# Patient Record
Sex: Female | Born: 1940
Health system: Southern US, Community
[De-identification: ages and names within clinical notes are randomized; demographics above are authoritative.]

## PROBLEM LIST (undated history)

## (undated) DIAGNOSIS — M1990 Unspecified osteoarthritis, unspecified site: Secondary | ICD-10-CM

## (undated) DIAGNOSIS — M719 Bursopathy, unspecified: Secondary | ICD-10-CM

## (undated) DIAGNOSIS — M810 Age-related osteoporosis without current pathological fracture: Secondary | ICD-10-CM

## (undated) DIAGNOSIS — I10 Essential (primary) hypertension: Secondary | ICD-10-CM

## (undated) DIAGNOSIS — R112 Nausea with vomiting, unspecified: Secondary | ICD-10-CM

## (undated) DIAGNOSIS — M797 Fibromyalgia: Secondary | ICD-10-CM

## (undated) DIAGNOSIS — G2581 Restless legs syndrome: Secondary | ICD-10-CM

## (undated) DIAGNOSIS — K219 Gastro-esophageal reflux disease without esophagitis: Secondary | ICD-10-CM

## (undated) DIAGNOSIS — Z9889 Other specified postprocedural states: Secondary | ICD-10-CM

## (undated) HISTORY — PX: SHOULDER ARTHROSCOPY: SHX128

## (undated) HISTORY — PX: TONSILLECTOMY: SUR1361

## (undated) HISTORY — PX: EYE SURGERY: SHX253

## (undated) HISTORY — PX: COLONOSCOPY: SHX174

## (undated) HISTORY — PX: TUBAL LIGATION: SHX77

## (undated) HISTORY — PX: NECK SURGERY: SHX720

## (undated) HISTORY — PX: CHOLECYSTECTOMY: SHX55

## (undated) HISTORY — PX: ABDOMINAL HYSTERECTOMY: SHX81

## (undated) HISTORY — PX: COCCYX REMOVAL: SHX600

## (undated) HISTORY — PX: APPENDECTOMY: SHX54

## (undated) HISTORY — DX: Bursopathy, unspecified: M71.9

---

## 1998-03-12 ENCOUNTER — Encounter: Admission: RE | Admit: 1998-03-12 | Discharge: 1998-06-10 | Payer: Self-pay

## 1998-04-27 ENCOUNTER — Ambulatory Visit (HOSPITAL_COMMUNITY): Admission: RE | Admit: 1998-04-27 | Discharge: 1998-04-27 | Payer: Self-pay | Admitting: Gastroenterology

## 1999-03-04 ENCOUNTER — Encounter (INDEPENDENT_AMBULATORY_CARE_PROVIDER_SITE_OTHER): Payer: Self-pay | Admitting: Specialist

## 1999-03-04 ENCOUNTER — Observation Stay (HOSPITAL_COMMUNITY): Admission: RE | Admit: 1999-03-04 | Discharge: 1999-03-05 | Payer: Self-pay | Admitting: Specialist

## 1999-03-04 ENCOUNTER — Encounter: Payer: Self-pay | Admitting: Specialist

## 1999-10-29 ENCOUNTER — Encounter: Admission: RE | Admit: 1999-10-29 | Discharge: 1999-10-29 | Payer: Self-pay | Admitting: Specialist

## 1999-10-29 ENCOUNTER — Encounter: Payer: Self-pay | Admitting: Specialist

## 2001-07-28 ENCOUNTER — Encounter: Payer: Self-pay | Admitting: Internal Medicine

## 2001-07-28 ENCOUNTER — Encounter: Admission: RE | Admit: 2001-07-28 | Discharge: 2001-07-28 | Payer: Self-pay | Admitting: Internal Medicine

## 2004-12-18 ENCOUNTER — Observation Stay (HOSPITAL_COMMUNITY): Admission: EM | Admit: 2004-12-18 | Discharge: 2004-12-19 | Payer: Self-pay | Admitting: Emergency Medicine

## 2004-12-18 ENCOUNTER — Encounter (INDEPENDENT_AMBULATORY_CARE_PROVIDER_SITE_OTHER): Payer: Self-pay | Admitting: *Deleted

## 2007-06-22 ENCOUNTER — Other Ambulatory Visit: Admission: RE | Admit: 2007-06-22 | Discharge: 2007-06-22 | Payer: Self-pay | Admitting: Family Medicine

## 2010-01-26 ENCOUNTER — Ambulatory Visit (HOSPITAL_COMMUNITY): Admission: RE | Admit: 2010-01-26 | Discharge: 2010-01-26 | Payer: Self-pay | Admitting: Urology

## 2010-07-14 ENCOUNTER — Encounter: Payer: Self-pay | Admitting: Urology

## 2010-11-08 NOTE — Op Note (Signed)
**Note De-Identified Adduci Obfuscation** NAMEPAULETT, Karen Dickerson                 ACCOUNT NO.:  [id number]   MEDICAL RECORD NO.:  [id number]          PATIENT TYPE:  INP   LOCATION:  0101                         FACILITY:  [facility]   PHYSICIAN:  Lorre Munroe., M.D.DATE OF BIRTH:  [birth year]   DATE OF PROCEDURE:  12/18/2004  DATE OF DISCHARGE:                                 OPERATIVE REPORT   PREOPERATIVE DIAGNOSES:  1.  Cholelithiasis.  2.  Acute cholecystitis.   POSTOPERATIVE DIAGNOSES:  1.  Cholelithiasis.  2.  Acute cholecystitis.   OPERATION:  Laparoscopic cholecystectomy.   SURGEON:  Lebron Conners, M.D.   ANESTHESIA:  General and local.   PROCEDURE:  After the patient was monitored and anesthetized, and had  routine preparation and draping of abdomen, I made a short incision just  below the umbilicus transversely through a site which I had liberally  anesthetized with local anesthetic.  I cut the fascia for about 2 cm in the  midline, then bluntly entered the peritoneum.  After assuring no adhesions  in that area, I placed a 0 Vicryl pursestring in the fascia and secured a  Hasson cannula and inflated the abdomen with CO2.  Laparoscopy disclosed  some adhesions of the liver to diaphragm and some omentum and viscera to the  anterior abdominal wall in lower midline, and showed a distended, edematous  gallbladder with adhesions of omentum to its undersurface.  I anesthetized 3  additional sites and placed a 10-mm epigastric port and two 5-mm mid-  abdominal ports under the direct view of the laparoscopic.  I then  decompressed the gallbladder with a suction aspirator.  The bile was  slightly depigmented, but not totally.  I then grasped the fundus of the  gallbladder and elevated it over the liver, and took down the adhesions.  I  then was able to see and grasp the infundibulum of the gallbladder and  pulled it laterally.  I dissected the hepatoduodenal ligament until I  clearly saw the cystic duct emerging from the  infundibulum.  I could see the  common bile duct separately to the patient's left.  I put 3 clips and cut  between the 2 that were closest to the liver.  I then dissected out the  cystic artery and clipped and divided it as well with hook and spatula  instruments, gaining hemostasis with the cautery as well.  There was just a  tiny bit of oozing right around the cystic duct stump, which I could not see  clearly, and so I packed that area with Surgicel.  When I inspected that at  the end of the operation, I saw no continued bleeding.  I irrigated the area  and removed the irrigant, and then irrigated again after placing the  gallbladder in a plastic pouch and holding it above the liver.  When I was  satisfied that there was no more bleeding or leakage of bile, I removed the  gallbladder through the umbilical incision and tied the pursestring.  I  removed the lateral ports under direct  vision of the scope and saw no **Note De-Identified Oravec Obfuscation** bleeding from the abdominal wall.  After  allowing the CO2 to escape, I removed the epigastric port and then closed  all skin incisions with intracuticular 4-0 Vicryl and Steri-Strips.  The  patient was stable throughout the procedure.       WB/MEDQ  D:  12/18/2004  T:  12/19/2004  Job:  [id number]

## 2010-11-08 NOTE — H&P (Signed)
**Note De-Identified Arington Obfuscation** NAMELAURA, Karen Dickerson NO.:  [id number]   MEDICAL RECORD NO.:  [id number]          PATIENT TYPE:  EMS   LOCATION:  ED                           FACILITY:  [facility]   PHYSICIAN:  Lorre Munroe., M.D.DATE OF BIRTH:  [birth year]   DATE OF ADMISSION:  12/18/2004  DATE OF DISCHARGE:                                HISTORY & PHYSICAL   CHIEF COMPLAINT:  Abdominal pain.   HISTORY OF PRESENT ILLNESS:  This is Karen 70 year old white female who has Karen  four day history of rather severe epigastric and right upper quadrant  abdominal pain. It seemed to be made worse by eating and seems to have been  worse at night. She had Karen gallbladder ultrasound done today demonstrating  gallstones by verbal report at [facility]. She is now at the  emergency department with continued pain and is feeling Karen little bit better  after having been given some intravenous pain medicine but still says it is  tender in her inner upper abdomen and still is Karen little bit painful. There  has been no jaundice, dark urine or light stools. The liver tests were  normal. The white blood cell count is normal. She is brought into the  hospital for Karen laparoscopic cholecystectomy.   PAST MEDICAL HISTORY:  She has fibromyalgia for which she takes muscle  relaxant and Ultram. She has high blood pressure and is on medication for  that ans says it is good control. No history of heart and lung problems.   SURGICAL HISTORY:  Hysterectomy and appendectomy. She has had Karen bone graft  taken from the left ilium for back surgery.   SOCIAL HISTORY:  She does not smoke or drink.   FAMILY HISTORY/CHILDHOOD ILLNESSES:  Unremarkable.   REVIEW OF SYSTEMS:  Negative for chest pain, shortness of breath, diarrhea,  fever or other disturbing symptoms.   PHYSICAL EXAM:  GENERAL:  Somewhat overweight. No acute distress. Mental  status normal.  VITAL SIGNS:  Unremarkable per nursing report.  HEENT:  The head, neck, eyes,  ears, nose, mouth and throat are unremarkable  with no enlargement of the thyroid, no neck mass.  CHEST:  Clear to auscultation.  HEART:  Rate and rhythm are normal. No murmur or gallop noted.  BREASTS:  Symmetric, no skin lesions, no masses.  ABDOMEN:  No organomegaly. Tender in the right upper quadrant with  inspiratory arrest on deep breath. Abdomen is fairly soft. No masses or  hernia are noted. Well-healed lower abdominal incision. GENITALIA:  Externally normal. Pelvic exam not done. Rectal not done.  EXTREMITIES:  No edema, good pulses, no deformity, no skin lesions. SKIN:  No lesions are noted.  LYMPH NODES:  Not enlarged in the groin, axilla or neck.  NEUROLOGIC:  Briefly and grossly normal.   IMPRESSION:  Symptomatic gallstones with severe colic and possible acute  cholecystitis.   PLAN:  Urgent laparoscopic cholecystectomy, possibilities of pulmonary  complications, infection, and bleeding. We will proceed with the operation  today.       WB/MEDQ  D:  12/18/2004  T: **Note De-Identified Sem Obfuscation** 12/18/2004  Job:  [id number]

## 2011-06-24 HISTORY — PX: CATARACT EXTRACTION: SUR2

## 2012-12-21 ENCOUNTER — Encounter (HOSPITAL_COMMUNITY): Payer: Self-pay

## 2012-12-21 ENCOUNTER — Ambulatory Visit (HOSPITAL_COMMUNITY)
Admission: RE | Admit: 2012-12-21 | Discharge: 2012-12-21 | Disposition: A | Discharge status: Home / Self Care | Payer: Medicare Other | Source: Ambulatory Visit | Attending: Family Medicine | Admitting: Family Medicine

## 2012-12-21 ENCOUNTER — Other Ambulatory Visit (HOSPITAL_COMMUNITY): Payer: Self-pay | Admitting: Family Medicine

## 2012-12-21 DIAGNOSIS — M810 Age-related osteoporosis without current pathological fracture: Secondary | ICD-10-CM | POA: Insufficient documentation

## 2012-12-21 HISTORY — DX: Restless legs syndrome: G25.81

## 2012-12-21 HISTORY — DX: Essential (primary) hypertension: I10

## 2012-12-21 HISTORY — DX: Fibromyalgia: M79.7

## 2012-12-21 HISTORY — DX: Unspecified osteoarthritis, unspecified site: M19.90

## 2012-12-21 MED ORDER — ZOLEDRONIC ACID 5 MG/100ML IV SOLN
5.0000 mg | Freq: Once | INTRAVENOUS | Status: AC
  Administered 2012-12-21: 5 mg via INTRAVENOUS
  Filled 2012-12-21: qty 100

## 2012-12-21 MED ORDER — SODIUM CHLORIDE 0.9 % IV SOLN
Freq: Once | INTRAVENOUS | Status: AC
  Administered 2012-12-21: 15:00:00 via INTRAVENOUS

## 2013-01-03 ENCOUNTER — Encounter: Payer: Self-pay | Admitting: Diagnostic Neuroimaging

## 2013-01-03 ENCOUNTER — Ambulatory Visit (INDEPENDENT_AMBULATORY_CARE_PROVIDER_SITE_OTHER): Payer: Medicare Other | Admitting: Diagnostic Neuroimaging

## 2013-01-03 VITALS — BP 140/73 | HR 69 | Temp 98.1°F | Ht 62.0 in | Wt 127.0 lb

## 2013-01-03 DIAGNOSIS — G2581 Restless legs syndrome: Secondary | ICD-10-CM

## 2013-01-03 NOTE — Patient Instructions (Signed)
**Note De-identified Coluccio Obfuscation** Continue current medications. 

## 2013-01-03 NOTE — Progress Notes (Signed)
**Note De-Identified Mansour Obfuscation** [facility]  PATIENT: Karen Dickerson DOB: 31-Jan-1941  REFERRING CLINICIAN: Hyacinth Meeker HISTORY FROM: patient REASON FOR VISIT: new consult   HISTORICAL  CHIEF COMPLAINT:  Chief Complaint  Patient presents with  . Tremors    HISTORY OF PRESENT ILLNESS:   72 year old left-handed female with hypertension, fibrotic or bursitis, here for evaluation of resting her.  One half years ago patient was diagnosed with restless leg syndrome. Patient was having intermittent, uneasy feeling in her legs, typically in the evening and nighttime. She was diagnosed with restless leg syndrome by her PCP and started on pramipexole 0.125 mg at night. This helped her symptoms greatly. However the past few months she's had a similar sensation developing in her hands and arms. Pramipexole was increased to BID and has helped her symptoms (over past 2 weeks). During PCP evaluation, resting tremor was noted. Patient referred to me for further evaluation.  Patient denies any gait or balance difficulties. For the drains, acting out dreams. No hallucinations. No change in sense of smell or taste.  Patient does endorse some nervousness anxiety type symptoms which are mild.  REVIEW OF SYSTEMS: Full 14 system review of systems performed and notable only for fatigue joint pain joint swelling aching muscles.  ALLERGIES: Allergies  Allergen Reactions  . Fish Oil Swelling  . Penicillins Swelling    HOME MEDICATIONS: Outpatient Prescriptions Prior to Visit  Medication Sig Dispense Refill  . amLODipine (NORVASC) 5 MG tablet Take 5 mg by mouth daily.      . cholecalciferol (VITAMIN D) 1000 UNITS tablet Take 1,000 Units by mouth daily.      . hydrochlorothiazide (MICROZIDE) 12.5 MG capsule Take 12.5 mg by mouth daily.      . traMADol (ULTRAM) 50 MG tablet Take 50 mg by mouth every 6 (six) hours as needed for pain.       No facility-administered medications prior to visit.    PAST MEDICAL  HISTORY: Past Medical History  Diagnosis Date  . Arthritis   . Fibromyalgia   . Hypertension   . Restless leg syndrome   . Bursitis     PAST SURGICAL HISTORY: Past Surgical History  Procedure Laterality Date  . Tonsillectomy    . Appendectomy    . Abdominal hysterectomy    . Cholecystectomy    . Tubal ligation    . Neck surgery      fusion  . Coccyx removal    . Eye surgery      b/l  . Shoulder arthroscopy      B/L  . Colonoscopy      FAMILY HISTORY: Family History  Problem Relation Age of Onset  . Stroke Mother   . Lung disease Father     SOCIAL HISTORY:  History   Social History  . Marital Status: Married    Spouse Name: Dimas Aguas    Number of Children: 2  . Years of Education: 12th   Occupational History  . Retired    Social History Main Topics  . Smoking status: Never Smoker   . Smokeless tobacco: Never Used  . Alcohol Use: No  . Drug Use: No  . Sexually Active: Not on file   Other Topics Concern  . Not on file   Social History Narrative   Patient lives at home with spouse.   Caffeine Use: none     PHYSICAL EXAM  Filed Vitals:   01/03/13 1026  BP: 140/73  Pulse: 69  Temp: 98.1 F (36.7 C) **Note De-Identified Saravia Obfuscation** TempSrc: Oral  Height: 5\' 2"  (1.575 m)  Weight: 127 lb (57.607 kg)    Not recorded    Body mass index is 23.22 kg/(m^2).  GENERAL EXAM: Patient is in no distress  CARDIOVASCULAR: Regular rate and rhythm, no murmurs, no carotid bruits  NEUROLOGIC: MENTAL STATUS: awake, alert, language fluent, comprehension intact, naming intact CRANIAL NERVE: no papilledema on fundoscopic exam, pupils equal and reactive to light, visual fields full to confrontation, extraocular muscles intact, no nystagmus, facial sensation and strength symmetric, uvula midline, shoulder shrug symmetric, tongue midline. MOTOR: MILD POSTURAL TREMOR. NO RIGIDITY. RARE INT REST TREMOR OF RUE WITH CONTRALATERAL RAM. Normal bulk and tone, full strength in the BUE, BLE SENSORY:  normal and symmetric to light touch, pinprick, temperature, vibration COORDINATION: finger-nose-finger, fine finger movements normal REFLEXES: deep tendon reflexes BRISK and symmetric GAIT/STATION: narrow based gait; able to walk on toes, heels and tandem; romberg is negative   DIAGNOSTIC DATA (LABS, IMAGING, TESTING) - I reviewed patient records, labs, notes, testing and imaging myself where available.  No results found for this basename: WBC, HGB, HCT, MCV, PLT   No results found for this basename: na, k, cl, co2, glucose, bun, creatinine, calcium, prot, albumin, ast, alt, alkphos, bilitot, gfrnonaa, gfraa   No results found for this basename: CHOL, HDL, LDLCALC, LDLDIRECT, TRIG, CHOLHDL   No results found for this basename: HGBA1C   No results found for this basename: VITAMINB12   No results found for this basename: TSH     ASSESSMENT AND PLAN  72 y.o. year old female  has a past medical history of Arthritis; Fibromyalgia; Hypertension; Restless leg syndrome; and Bursitis. here with one half years of an easy vessel sensation in her legs, now progressing to her arms. Likely represents spectrum of restless leg syndrome. She's doing better on pramipexole 0.125 mg twice a day now for the past 2 weeks. In addition patient has mixed tremor of the upper extremity, postural greater than resting, without other extrapyramidal symptoms. This likely represents essential tremor.  PLAN: 1. Continue pramipexole 0.125 mg twice a day; patient is satisfied with current dose 2. Ask PCP if CBC, iron studies, B12, TSH and diabetes screening have been performed, as restless leg syndrome can be associated with iron deficiency anemia or neuropathy.   Suanne Marker, MD 01/03/2013, 10:57 AM Certified in Neurology, Neurophysiology and Neuroimaging  [facility] [address] [place], [place] [address] [phone]

## 2013-07-07 ENCOUNTER — Ambulatory Visit: Payer: Medicare Other | Admitting: Diagnostic Neuroimaging

## 2014-01-02 ENCOUNTER — Other Ambulatory Visit (HOSPITAL_COMMUNITY): Payer: Self-pay | Admitting: Family Medicine

## 2014-01-02 ENCOUNTER — Ambulatory Visit (HOSPITAL_COMMUNITY)
Admission: RE | Admit: 2014-01-02 | Discharge: 2014-01-02 | Disposition: A | Discharge status: Home / Self Care | Payer: Medicare Other | Source: Ambulatory Visit | Attending: Family Medicine | Admitting: Family Medicine

## 2014-01-02 ENCOUNTER — Encounter (HOSPITAL_COMMUNITY): Payer: Self-pay

## 2014-01-02 DIAGNOSIS — M810 Age-related osteoporosis without current pathological fracture: Secondary | ICD-10-CM | POA: Insufficient documentation

## 2014-01-02 HISTORY — DX: Age-related osteoporosis without current pathological fracture: M81.0

## 2014-01-02 MED ORDER — ZOLEDRONIC ACID 5 MG/100ML IV SOLN
5.0000 mg | Freq: Once | INTRAVENOUS | Status: AC
  Administered 2014-01-02: 5 mg via INTRAVENOUS
  Filled 2014-01-02: qty 100

## 2014-01-02 MED ORDER — SODIUM CHLORIDE 0.9 % IV SOLN
Freq: Once | INTRAVENOUS | Status: AC
  Administered 2014-01-02: 250 mL via INTRAVENOUS

## 2014-01-02 NOTE — Discharge Instructions (Signed)
**Note De-identified Solis Obfuscation** Zoledronic Acid injection (Paget's Disease, Osteoporosis) °What is this medicine? °ZOLEDRONIC ACID (ZOE le dron ik AS id) lowers the amount of calcium loss from bone. It is used to treat Paget's disease and osteoporosis in women. °This medicine may be used for other purposes; ask your health care provider or pharmacist if you have questions. °COMMON BRAND NAME(S): Reclast, Zometa °What should I tell my health care provider before I take this medicine? °They need to know if you have any of these conditions: °-aspirin-sensitive asthma °-cancer, especially if you are receiving medicines used to treat cancer °-dental disease or wear dentures °-infection °-kidney disease °-low levels of calcium in the blood °-past surgery on the parathyroid gland or intestines °-receiving corticosteroids like dexamethasone or prednisone °-an unusual or allergic reaction to zoledronic acid, other medicines, foods, dyes, or preservatives °-pregnant or trying to get pregnant °-breast-feeding °How should I use this medicine? °This medicine is for infusion into a vein. It is given by a health care professional in a hospital or clinic setting. °Talk to your pediatrician regarding the use of this medicine in children. This medicine is not approved for use in children. °Overdosage: If you think you have taken too much of this medicine contact a poison control center or emergency room at once. °NOTE: This medicine is only for you. Do not share this medicine with others. °What if I miss a dose? °It is important not to miss your dose. Call your doctor or health care professional if you are unable to keep an appointment. °What may interact with this medicine? °-certain antibiotics given by injection °-NSAIDs, medicines for pain and inflammation, like ibuprofen or naproxen °-some diuretics like bumetanide, furosemide °-teriparatide °This list may not describe all possible interactions. Give your health care provider a list of all the medicines,  herbs, non-prescription drugs, or dietary supplements you use. Also tell them if you smoke, drink alcohol, or use illegal drugs. Some items may interact with your medicine. °What should I watch for while using this medicine? °Visit your doctor or health care professional for regular checkups. It may be some time before you see the benefit from this medicine. Do not stop taking your medicine unless your doctor tells you to. Your doctor may order blood tests or other tests to see how you are doing. °Women should inform their doctor if they wish to become pregnant or think they might be pregnant. There is a potential for serious side effects to an unborn child. Talk to your health care professional or pharmacist for more information. °You should make sure that you get enough calcium and vitamin D while you are taking this medicine. Discuss the foods you eat and the vitamins you take with your health care professional. °Some people who take this medicine have severe bone, joint, and/or muscle pain. This medicine may also increase your risk for jaw problems or a broken thigh bone. Tell your doctor right away if you have severe pain in your jaw, bones, joints, or muscles. Tell your doctor if you have any pain that does not go away or that gets worse. °Tell your dentist and dental surgeon that you are taking this medicine. You should not have major dental surgery while on this medicine. See your dentist to have a dental exam and fix any dental problems before starting this medicine. Take good care of your teeth while on this medicine. Make sure you see your dentist for regular follow-up appointments. °What side effects may I notice from receiving this medicine? ° **Note De-Identified Perales Obfuscation** Side effects that you should report to your doctor or health care professional as soon as possible: -allergic reactions like skin rash, itching or hives, swelling of the face, lips, or tongue -anxiety, confusion, or depression -breathing problems -changes in  vision -eye pain -feeling faint or lightheaded, falls -jaw pain, especially after dental work -mouth sores -muscle cramps, stiffness, or weakness -trouble passing urine or change in the amount of urine Side effects that usually do not require medical attention (report to your doctor or health care professional if they continue or are bothersome): -bone, joint, or muscle pain -constipation -diarrhea -fever -hair loss -irritation at site where injected -loss of appetite -nausea, vomiting -stomach upset -trouble sleeping -trouble swallowing -weak or tired This list may not describe all possible side effects. Call your doctor for medical advice about side effects. You may report side effects to FDA at 1-800-FDA-1088. Where should I keep my medicine? This drug is given in a hospital or clinic and will not be stored at home. NOTE: This sheet is a summary. It may not cover all possible information. If you have questions about this medicine, talk to your doctor, pharmacist, or health care provider.  2015, Elsevier/Gold Standard. (2012-11-22 10:03:48) Osteoporosis Throughout your life, your body breaks down old bone and replaces it with new bone. As you get older, your body does not replace bone as quickly as it breaks it down. By the age of 23 years, most people begin to gradually lose bone because of the imbalance between bone loss and replacement. Some people lose more bone than others. Bone loss beyond a specified normal degree is considered osteoporosis.  Osteoporosis affects the strength and durability of your bones. The inside of the ends of your bones and your flat bones, like the bones of your pelvis, look like honeycomb, filled with tiny open spaces. As bone loss occurs, your bones become less dense. This means that the open spaces inside your bones become bigger and the walls between these spaces become thinner. This makes your bones weaker. Bones of a person with osteoporosis can  become so weak that they can break (fracture) during minor accidents, such as a simple fall. CAUSES  The following factors have been associated with the development of osteoporosis:  Smoking.  Drinking more than 2 alcoholic drinks several days per week.  Long-term use of certain medicines:  Corticosteroids.  Chemotherapy medicines.  Thyroid medicines.  Antiepileptic medicines.  Gonadal hormone suppression medicine.  Immunosuppression medicine.  Being underweight.  Lack of physical activity.  Lack of exposure to the sun. This can lead to vitamin D deficiency.  Certain medical conditions:  Certain inflammatory bowel diseases, such as Crohn disease and ulcerative colitis.  Diabetes.  Hyperthyroidism.  Hyperparathyroidism. RISK FACTORS Anyone can develop osteoporosis. However, the following factors can increase your risk of developing osteoporosis:  Gender--Women are at higher risk than men.  Age--Being older than 50 years increases your risk.  Ethnicity--White and Asian people have an increased risk.  Weight --Being extremely underweight can increase your risk of osteoporosis.  Family history of osteoporosis--Having a family member who has developed osteoporosis can increase your risk. SYMPTOMS  Usually, people with osteoporosis have no symptoms.  DIAGNOSIS  Signs during a physical exam that may prompt your caregiver to suspect osteoporosis include:  Decreased height. This is usually caused by the compression of the bones that form your spine (vertebrae) because they have weakened and become fractured.  A curving or rounding of the upper back (kyphosis). To **Note De-Identified Leathers Obfuscation** confirm signs of osteoporosis, your caregiver may request a procedure that uses 2 low-dose X-ray beams with different levels of energy to measure your bone mineral density (dual-energy X-ray absorptiometry [DXA]). Also, your caregiver may check your level of vitamin D. TREATMENT  The goal of osteoporosis  treatment is to strengthen bones in order to decrease the risk of bone fractures. There are different types of medicines available to help achieve this goal. Some of these medicines work by slowing the processes of bone loss. Some medicines work by increasing bone density. Treatment also involves making sure that your levels of calcium and vitamin D are adequate. PREVENTION  There are things you can do to help prevent osteoporosis. Adequate intake of calcium and vitamin D can help you achieve optimal bone mineral density. Regular exercise can also help, especially resistance and weight-bearing activities. If you smoke, quitting smoking is an important part of osteoporosis prevention. MAKE SURE YOU:  Understand these instructions.  Will watch your condition.  Will get help right away if you are not doing well or get worse. FOR MORE INFORMATION www.osteo.org and EquipmentWeekly.com.ee Document Released: 03/19/2005 Document Revised: 10/04/2012 Document Reviewed: 05/24/2011 [facility] Patient Information 2015 [place], [place]. This information is not intended to replace advice given to you by your health care provider. Make sure you discuss any questions you have with your health care provider.

## 2014-01-02 NOTE — Progress Notes (Signed)
**Note De-Identified Lamantia Obfuscation** Pt states she does not take a calcium but does take dietary calcium per her Dr's recommendation. Pt will discuss any further changes in calcium intake

## 2015-07-12 DIAGNOSIS — M8580 Other specified disorders of bone density and structure, unspecified site: Secondary | ICD-10-CM | POA: Diagnosis not present

## 2015-10-11 DIAGNOSIS — M1711 Unilateral primary osteoarthritis, right knee: Secondary | ICD-10-CM | POA: Diagnosis not present

## 2015-11-12 DIAGNOSIS — Z1231 Encounter for screening mammogram for malignant neoplasm of breast: Secondary | ICD-10-CM | POA: Diagnosis not present

## 2015-11-13 DIAGNOSIS — M1711 Unilateral primary osteoarthritis, right knee: Secondary | ICD-10-CM | POA: Diagnosis not present

## 2015-11-20 DIAGNOSIS — M1711 Unilateral primary osteoarthritis, right knee: Secondary | ICD-10-CM | POA: Diagnosis not present

## 2015-11-27 DIAGNOSIS — M1711 Unilateral primary osteoarthritis, right knee: Secondary | ICD-10-CM | POA: Diagnosis not present

## 2015-12-04 DIAGNOSIS — M7051 Other bursitis of knee, right knee: Secondary | ICD-10-CM | POA: Diagnosis not present

## 2015-12-04 DIAGNOSIS — M1711 Unilateral primary osteoarthritis, right knee: Secondary | ICD-10-CM | POA: Diagnosis not present

## 2015-12-04 DIAGNOSIS — M25561 Pain in right knee: Secondary | ICD-10-CM | POA: Diagnosis not present

## 2015-12-14 DIAGNOSIS — M1711 Unilateral primary osteoarthritis, right knee: Secondary | ICD-10-CM | POA: Diagnosis not present

## 2016-01-09 DIAGNOSIS — M1990 Unspecified osteoarthritis, unspecified site: Secondary | ICD-10-CM | POA: Diagnosis not present

## 2016-01-09 DIAGNOSIS — R946 Abnormal results of thyroid function studies: Secondary | ICD-10-CM | POA: Diagnosis not present

## 2016-01-09 DIAGNOSIS — R7301 Impaired fasting glucose: Secondary | ICD-10-CM | POA: Diagnosis not present

## 2016-01-09 DIAGNOSIS — G2581 Restless legs syndrome: Secondary | ICD-10-CM | POA: Diagnosis not present

## 2016-01-09 DIAGNOSIS — M899 Disorder of bone, unspecified: Secondary | ICD-10-CM | POA: Diagnosis not present

## 2016-01-09 DIAGNOSIS — M810 Age-related osteoporosis without current pathological fracture: Secondary | ICD-10-CM | POA: Diagnosis not present

## 2016-01-09 DIAGNOSIS — R899 Unspecified abnormal finding in specimens from other organs, systems and tissues: Secondary | ICD-10-CM | POA: Diagnosis not present

## 2016-01-09 DIAGNOSIS — Z0000 Encounter for general adult medical examination without abnormal findings: Secondary | ICD-10-CM | POA: Diagnosis not present

## 2016-01-09 DIAGNOSIS — R8290 Unspecified abnormal findings in urine: Secondary | ICD-10-CM | POA: Diagnosis not present

## 2016-01-09 DIAGNOSIS — I10 Essential (primary) hypertension: Secondary | ICD-10-CM | POA: Diagnosis not present

## 2016-01-10 DIAGNOSIS — M810 Age-related osteoporosis without current pathological fracture: Secondary | ICD-10-CM | POA: Diagnosis not present

## 2016-01-10 DIAGNOSIS — R8290 Unspecified abnormal findings in urine: Secondary | ICD-10-CM | POA: Diagnosis not present

## 2016-01-15 DIAGNOSIS — R7989 Other specified abnormal findings of blood chemistry: Secondary | ICD-10-CM | POA: Diagnosis not present

## 2016-02-19 DIAGNOSIS — R899 Unspecified abnormal finding in specimens from other organs, systems and tissues: Secondary | ICD-10-CM | POA: Diagnosis not present

## 2016-02-19 DIAGNOSIS — M899 Disorder of bone, unspecified: Secondary | ICD-10-CM | POA: Diagnosis not present

## 2016-02-19 DIAGNOSIS — R740 Nonspecific elevation of levels of transaminase and lactic acid dehydrogenase [LDH]: Secondary | ICD-10-CM | POA: Diagnosis not present

## 2016-02-19 DIAGNOSIS — R946 Abnormal results of thyroid function studies: Secondary | ICD-10-CM | POA: Diagnosis not present

## 2016-02-19 DIAGNOSIS — M1990 Unspecified osteoarthritis, unspecified site: Secondary | ICD-10-CM | POA: Diagnosis not present

## 2016-02-19 DIAGNOSIS — I10 Essential (primary) hypertension: Secondary | ICD-10-CM | POA: Diagnosis not present

## 2016-02-19 DIAGNOSIS — M810 Age-related osteoporosis without current pathological fracture: Secondary | ICD-10-CM | POA: Diagnosis not present

## 2016-02-19 DIAGNOSIS — R8290 Unspecified abnormal findings in urine: Secondary | ICD-10-CM | POA: Diagnosis not present

## 2016-02-19 DIAGNOSIS — G2581 Restless legs syndrome: Secondary | ICD-10-CM | POA: Diagnosis not present

## 2016-02-19 DIAGNOSIS — Z0000 Encounter for general adult medical examination without abnormal findings: Secondary | ICD-10-CM | POA: Diagnosis not present

## 2016-02-19 DIAGNOSIS — R7301 Impaired fasting glucose: Secondary | ICD-10-CM | POA: Diagnosis not present

## 2016-06-02 DIAGNOSIS — Z961 Presence of intraocular lens: Secondary | ICD-10-CM | POA: Diagnosis not present

## 2016-07-14 DIAGNOSIS — M810 Age-related osteoporosis without current pathological fracture: Secondary | ICD-10-CM | POA: Diagnosis not present

## 2016-11-12 DIAGNOSIS — M8589 Other specified disorders of bone density and structure, multiple sites: Secondary | ICD-10-CM | POA: Diagnosis not present

## 2016-11-12 DIAGNOSIS — Z1231 Encounter for screening mammogram for malignant neoplasm of breast: Secondary | ICD-10-CM | POA: Diagnosis not present

## 2016-11-12 DIAGNOSIS — M810 Age-related osteoporosis without current pathological fracture: Secondary | ICD-10-CM | POA: Diagnosis not present

## 2016-11-20 DIAGNOSIS — M545 Low back pain: Secondary | ICD-10-CM | POA: Diagnosis not present

## 2016-11-29 DIAGNOSIS — M545 Low back pain: Secondary | ICD-10-CM | POA: Diagnosis not present

## 2016-12-05 DIAGNOSIS — L308 Other specified dermatitis: Secondary | ICD-10-CM | POA: Diagnosis not present

## 2016-12-05 DIAGNOSIS — L821 Other seborrheic keratosis: Secondary | ICD-10-CM | POA: Diagnosis not present

## 2016-12-23 DIAGNOSIS — M545 Low back pain: Secondary | ICD-10-CM | POA: Diagnosis not present

## 2016-12-30 DIAGNOSIS — M2560 Stiffness of unspecified joint, not elsewhere classified: Secondary | ICD-10-CM | POA: Diagnosis not present

## 2016-12-30 DIAGNOSIS — M545 Low back pain: Secondary | ICD-10-CM | POA: Diagnosis not present

## 2016-12-31 DIAGNOSIS — M545 Low back pain: Secondary | ICD-10-CM | POA: Diagnosis not present

## 2016-12-31 DIAGNOSIS — M2560 Stiffness of unspecified joint, not elsewhere classified: Secondary | ICD-10-CM | POA: Diagnosis not present

## 2017-01-05 DIAGNOSIS — M2560 Stiffness of unspecified joint, not elsewhere classified: Secondary | ICD-10-CM | POA: Diagnosis not present

## 2017-01-05 DIAGNOSIS — M545 Low back pain: Secondary | ICD-10-CM | POA: Diagnosis not present

## 2017-01-07 DIAGNOSIS — M2560 Stiffness of unspecified joint, not elsewhere classified: Secondary | ICD-10-CM | POA: Diagnosis not present

## 2017-01-07 DIAGNOSIS — M545 Low back pain: Secondary | ICD-10-CM | POA: Diagnosis not present

## 2017-01-12 DIAGNOSIS — M545 Low back pain: Secondary | ICD-10-CM | POA: Diagnosis not present

## 2017-01-12 DIAGNOSIS — M85851 Other specified disorders of bone density and structure, right thigh: Secondary | ICD-10-CM | POA: Diagnosis not present

## 2017-01-12 DIAGNOSIS — M2560 Stiffness of unspecified joint, not elsewhere classified: Secondary | ICD-10-CM | POA: Diagnosis not present

## 2017-01-14 DIAGNOSIS — M2560 Stiffness of unspecified joint, not elsewhere classified: Secondary | ICD-10-CM | POA: Diagnosis not present

## 2017-01-14 DIAGNOSIS — M545 Low back pain: Secondary | ICD-10-CM | POA: Diagnosis not present

## 2017-01-29 DIAGNOSIS — H6120 Impacted cerumen, unspecified ear: Secondary | ICD-10-CM | POA: Diagnosis not present

## 2017-01-29 DIAGNOSIS — M1990 Unspecified osteoarthritis, unspecified site: Secondary | ICD-10-CM | POA: Diagnosis not present

## 2017-01-29 DIAGNOSIS — L989 Disorder of the skin and subcutaneous tissue, unspecified: Secondary | ICD-10-CM | POA: Diagnosis not present

## 2017-01-29 DIAGNOSIS — H6121 Impacted cerumen, right ear: Secondary | ICD-10-CM | POA: Diagnosis not present

## 2017-01-29 DIAGNOSIS — M810 Age-related osteoporosis without current pathological fracture: Secondary | ICD-10-CM | POA: Diagnosis not present

## 2017-01-29 DIAGNOSIS — G2581 Restless legs syndrome: Secondary | ICD-10-CM | POA: Diagnosis not present

## 2017-01-29 DIAGNOSIS — Z0000 Encounter for general adult medical examination without abnormal findings: Secondary | ICD-10-CM | POA: Diagnosis not present

## 2017-01-29 DIAGNOSIS — E663 Overweight: Secondary | ICD-10-CM | POA: Diagnosis not present

## 2017-01-29 DIAGNOSIS — I10 Essential (primary) hypertension: Secondary | ICD-10-CM | POA: Diagnosis not present

## 2017-01-29 DIAGNOSIS — M85832 Other specified disorders of bone density and structure, left forearm: Secondary | ICD-10-CM | POA: Diagnosis not present

## 2017-01-29 DIAGNOSIS — Z6825 Body mass index (BMI) 25.0-25.9, adult: Secondary | ICD-10-CM | POA: Diagnosis not present

## 2017-01-29 DIAGNOSIS — R7301 Impaired fasting glucose: Secondary | ICD-10-CM | POA: Diagnosis not present

## 2017-02-13 DIAGNOSIS — L258 Unspecified contact dermatitis due to other agents: Secondary | ICD-10-CM | POA: Diagnosis not present

## 2017-02-13 DIAGNOSIS — L570 Actinic keratosis: Secondary | ICD-10-CM | POA: Diagnosis not present

## 2017-02-13 DIAGNOSIS — X32XXXD Exposure to sunlight, subsequent encounter: Secondary | ICD-10-CM | POA: Diagnosis not present

## 2017-02-13 DIAGNOSIS — L820 Inflamed seborrheic keratosis: Secondary | ICD-10-CM | POA: Diagnosis not present

## 2017-07-02 DIAGNOSIS — C4442 Squamous cell carcinoma of skin of scalp and neck: Secondary | ICD-10-CM | POA: Diagnosis not present

## 2017-07-16 DIAGNOSIS — M810 Age-related osteoporosis without current pathological fracture: Secondary | ICD-10-CM | POA: Diagnosis not present

## 2017-07-30 DIAGNOSIS — C44619 Basal cell carcinoma of skin of left upper limb, including shoulder: Secondary | ICD-10-CM | POA: Diagnosis not present

## 2017-07-30 DIAGNOSIS — Z08 Encounter for follow-up examination after completed treatment for malignant neoplasm: Secondary | ICD-10-CM | POA: Diagnosis not present

## 2017-07-30 DIAGNOSIS — Z85828 Personal history of other malignant neoplasm of skin: Secondary | ICD-10-CM | POA: Diagnosis not present

## 2017-09-10 DIAGNOSIS — L821 Other seborrheic keratosis: Secondary | ICD-10-CM | POA: Diagnosis not present

## 2017-09-10 DIAGNOSIS — Z08 Encounter for follow-up examination after completed treatment for malignant neoplasm: Secondary | ICD-10-CM | POA: Diagnosis not present

## 2017-09-10 DIAGNOSIS — Z85828 Personal history of other malignant neoplasm of skin: Secondary | ICD-10-CM | POA: Diagnosis not present

## 2017-09-10 DIAGNOSIS — L820 Inflamed seborrheic keratosis: Secondary | ICD-10-CM | POA: Diagnosis not present

## 2017-09-14 DIAGNOSIS — Z961 Presence of intraocular lens: Secondary | ICD-10-CM | POA: Diagnosis not present

## 2017-09-14 DIAGNOSIS — H04123 Dry eye syndrome of bilateral lacrimal glands: Secondary | ICD-10-CM | POA: Diagnosis not present

## 2017-12-04 DIAGNOSIS — M25462 Effusion, left knee: Secondary | ICD-10-CM | POA: Diagnosis not present

## 2017-12-15 DIAGNOSIS — Z1231 Encounter for screening mammogram for malignant neoplasm of breast: Secondary | ICD-10-CM | POA: Diagnosis not present

## 2017-12-31 DIAGNOSIS — M545 Low back pain: Secondary | ICD-10-CM | POA: Diagnosis not present

## 2018-01-14 DIAGNOSIS — M810 Age-related osteoporosis without current pathological fracture: Secondary | ICD-10-CM | POA: Diagnosis not present

## 2018-02-25 DIAGNOSIS — Z1389 Encounter for screening for other disorder: Secondary | ICD-10-CM | POA: Diagnosis not present

## 2018-02-25 DIAGNOSIS — R7301 Impaired fasting glucose: Secondary | ICD-10-CM | POA: Diagnosis not present

## 2018-02-25 DIAGNOSIS — I10 Essential (primary) hypertension: Secondary | ICD-10-CM | POA: Diagnosis not present

## 2018-02-25 DIAGNOSIS — M810 Age-related osteoporosis without current pathological fracture: Secondary | ICD-10-CM | POA: Diagnosis not present

## 2018-02-25 DIAGNOSIS — Z0000 Encounter for general adult medical examination without abnormal findings: Secondary | ICD-10-CM | POA: Diagnosis not present

## 2018-02-25 DIAGNOSIS — G2581 Restless legs syndrome: Secondary | ICD-10-CM | POA: Diagnosis not present

## 2018-02-25 DIAGNOSIS — M545 Low back pain: Secondary | ICD-10-CM | POA: Diagnosis not present

## 2018-03-11 DIAGNOSIS — L820 Inflamed seborrheic keratosis: Secondary | ICD-10-CM | POA: Diagnosis not present

## 2018-03-11 DIAGNOSIS — D225 Melanocytic nevi of trunk: Secondary | ICD-10-CM | POA: Diagnosis not present

## 2018-03-11 DIAGNOSIS — L821 Other seborrheic keratosis: Secondary | ICD-10-CM | POA: Diagnosis not present

## 2018-06-30 DIAGNOSIS — M67911 Unspecified disorder of synovium and tendon, right shoulder: Secondary | ICD-10-CM | POA: Diagnosis not present

## 2018-06-30 DIAGNOSIS — M25511 Pain in right shoulder: Secondary | ICD-10-CM | POA: Diagnosis not present

## 2018-06-30 DIAGNOSIS — M79644 Pain in right finger(s): Secondary | ICD-10-CM | POA: Diagnosis not present

## 2018-07-19 DIAGNOSIS — M810 Age-related osteoporosis without current pathological fracture: Secondary | ICD-10-CM | POA: Diagnosis not present

## 2018-09-06 DIAGNOSIS — H2189 Other specified disorders of iris and ciliary body: Secondary | ICD-10-CM | POA: Diagnosis not present

## 2018-09-06 DIAGNOSIS — Z961 Presence of intraocular lens: Secondary | ICD-10-CM | POA: Diagnosis not present

## 2018-09-06 DIAGNOSIS — D3132 Benign neoplasm of left choroid: Secondary | ICD-10-CM | POA: Diagnosis not present

## 2018-09-10 DIAGNOSIS — G894 Chronic pain syndrome: Secondary | ICD-10-CM | POA: Diagnosis not present

## 2018-12-13 DIAGNOSIS — M1711 Unilateral primary osteoarthritis, right knee: Secondary | ICD-10-CM | POA: Diagnosis not present

## 2018-12-13 DIAGNOSIS — M79644 Pain in right finger(s): Secondary | ICD-10-CM | POA: Diagnosis not present

## 2018-12-13 DIAGNOSIS — M25561 Pain in right knee: Secondary | ICD-10-CM | POA: Diagnosis not present

## 2018-12-13 DIAGNOSIS — M79671 Pain in right foot: Secondary | ICD-10-CM | POA: Diagnosis not present

## 2018-12-15 DIAGNOSIS — I10 Essential (primary) hypertension: Secondary | ICD-10-CM | POA: Diagnosis not present

## 2018-12-15 DIAGNOSIS — H269 Unspecified cataract: Secondary | ICD-10-CM | POA: Diagnosis not present

## 2018-12-15 DIAGNOSIS — M1990 Unspecified osteoarthritis, unspecified site: Secondary | ICD-10-CM | POA: Diagnosis not present

## 2018-12-15 DIAGNOSIS — M810 Age-related osteoporosis without current pathological fracture: Secondary | ICD-10-CM | POA: Diagnosis not present

## 2018-12-21 DIAGNOSIS — Z90710 Acquired absence of both cervix and uterus: Secondary | ICD-10-CM | POA: Diagnosis not present

## 2018-12-21 DIAGNOSIS — M810 Age-related osteoporosis without current pathological fracture: Secondary | ICD-10-CM | POA: Diagnosis not present

## 2018-12-21 DIAGNOSIS — Z8262 Family history of osteoporosis: Secondary | ICD-10-CM | POA: Diagnosis not present

## 2018-12-21 DIAGNOSIS — Z1231 Encounter for screening mammogram for malignant neoplasm of breast: Secondary | ICD-10-CM | POA: Diagnosis not present

## 2018-12-22 DIAGNOSIS — R300 Dysuria: Secondary | ICD-10-CM | POA: Diagnosis not present

## 2019-01-24 DIAGNOSIS — M810 Age-related osteoporosis without current pathological fracture: Secondary | ICD-10-CM | POA: Diagnosis not present

## 2019-02-15 DIAGNOSIS — M67912 Unspecified disorder of synovium and tendon, left shoulder: Secondary | ICD-10-CM | POA: Diagnosis not present

## 2019-02-15 DIAGNOSIS — M24812 Other specific joint derangements of left shoulder, not elsewhere classified: Secondary | ICD-10-CM | POA: Diagnosis not present

## 2019-03-11 DIAGNOSIS — Z85828 Personal history of other malignant neoplasm of skin: Secondary | ICD-10-CM | POA: Diagnosis not present

## 2019-03-11 DIAGNOSIS — Z08 Encounter for follow-up examination after completed treatment for malignant neoplasm: Secondary | ICD-10-CM | POA: Diagnosis not present

## 2019-03-11 DIAGNOSIS — D225 Melanocytic nevi of trunk: Secondary | ICD-10-CM | POA: Diagnosis not present

## 2019-03-28 DIAGNOSIS — M67912 Unspecified disorder of synovium and tendon, left shoulder: Secondary | ICD-10-CM | POA: Diagnosis not present

## 2019-03-28 DIAGNOSIS — M7062 Trochanteric bursitis, left hip: Secondary | ICD-10-CM | POA: Diagnosis not present

## 2019-03-30 DIAGNOSIS — R7301 Impaired fasting glucose: Secondary | ICD-10-CM | POA: Diagnosis not present

## 2019-03-30 DIAGNOSIS — Z0000 Encounter for general adult medical examination without abnormal findings: Secondary | ICD-10-CM | POA: Diagnosis not present

## 2019-03-30 DIAGNOSIS — G2581 Restless legs syndrome: Secondary | ICD-10-CM | POA: Diagnosis not present

## 2019-03-30 DIAGNOSIS — M810 Age-related osteoporosis without current pathological fracture: Secondary | ICD-10-CM | POA: Diagnosis not present

## 2019-03-30 DIAGNOSIS — M1990 Unspecified osteoarthritis, unspecified site: Secondary | ICD-10-CM | POA: Diagnosis not present

## 2019-03-30 DIAGNOSIS — R8290 Unspecified abnormal findings in urine: Secondary | ICD-10-CM | POA: Diagnosis not present

## 2019-03-30 DIAGNOSIS — I10 Essential (primary) hypertension: Secondary | ICD-10-CM | POA: Diagnosis not present

## 2019-03-30 DIAGNOSIS — H269 Unspecified cataract: Secondary | ICD-10-CM | POA: Diagnosis not present

## 2019-06-03 DIAGNOSIS — R5383 Other fatigue: Secondary | ICD-10-CM | POA: Diagnosis not present

## 2019-06-03 DIAGNOSIS — J029 Acute pharyngitis, unspecified: Secondary | ICD-10-CM | POA: Diagnosis not present

## 2019-06-06 DIAGNOSIS — Z03818 Encounter for observation for suspected exposure to other biological agents ruled out: Secondary | ICD-10-CM | POA: Diagnosis not present

## 2019-06-06 DIAGNOSIS — R5383 Other fatigue: Secondary | ICD-10-CM | POA: Diagnosis not present

## 2019-06-06 DIAGNOSIS — J029 Acute pharyngitis, unspecified: Secondary | ICD-10-CM | POA: Diagnosis not present

## 2019-06-14 DIAGNOSIS — M810 Age-related osteoporosis without current pathological fracture: Secondary | ICD-10-CM | POA: Diagnosis not present

## 2019-06-14 DIAGNOSIS — I10 Essential (primary) hypertension: Secondary | ICD-10-CM | POA: Diagnosis not present

## 2019-06-14 DIAGNOSIS — H269 Unspecified cataract: Secondary | ICD-10-CM | POA: Diagnosis not present

## 2019-06-14 DIAGNOSIS — M1990 Unspecified osteoarthritis, unspecified site: Secondary | ICD-10-CM | POA: Diagnosis not present

## 2019-07-25 DIAGNOSIS — I10 Essential (primary) hypertension: Secondary | ICD-10-CM | POA: Diagnosis not present

## 2019-07-25 DIAGNOSIS — H269 Unspecified cataract: Secondary | ICD-10-CM | POA: Diagnosis not present

## 2019-07-25 DIAGNOSIS — M1990 Unspecified osteoarthritis, unspecified site: Secondary | ICD-10-CM | POA: Diagnosis not present

## 2019-07-25 DIAGNOSIS — M810 Age-related osteoporosis without current pathological fracture: Secondary | ICD-10-CM | POA: Diagnosis not present

## 2019-07-28 DIAGNOSIS — S99921A Unspecified injury of right foot, initial encounter: Secondary | ICD-10-CM | POA: Diagnosis not present

## 2019-07-28 DIAGNOSIS — M810 Age-related osteoporosis without current pathological fracture: Secondary | ICD-10-CM | POA: Diagnosis not present

## 2019-07-28 DIAGNOSIS — M79674 Pain in right toe(s): Secondary | ICD-10-CM | POA: Diagnosis not present

## 2019-09-12 DIAGNOSIS — H2189 Other specified disorders of iris and ciliary body: Secondary | ICD-10-CM | POA: Diagnosis not present

## 2019-09-12 DIAGNOSIS — Z961 Presence of intraocular lens: Secondary | ICD-10-CM | POA: Diagnosis not present

## 2019-09-12 DIAGNOSIS — M7061 Trochanteric bursitis, right hip: Secondary | ICD-10-CM | POA: Diagnosis not present

## 2019-09-12 DIAGNOSIS — M7062 Trochanteric bursitis, left hip: Secondary | ICD-10-CM | POA: Diagnosis not present

## 2019-09-12 DIAGNOSIS — D3132 Benign neoplasm of left choroid: Secondary | ICD-10-CM | POA: Diagnosis not present

## 2019-09-28 DIAGNOSIS — S32010S Wedge compression fracture of first lumbar vertebra, sequela: Secondary | ICD-10-CM | POA: Diagnosis not present

## 2019-09-28 DIAGNOSIS — Z79899 Other long term (current) drug therapy: Secondary | ICD-10-CM | POA: Diagnosis not present

## 2019-09-28 DIAGNOSIS — M79674 Pain in right toe(s): Secondary | ICD-10-CM | POA: Diagnosis not present

## 2019-09-28 DIAGNOSIS — G894 Chronic pain syndrome: Secondary | ICD-10-CM | POA: Diagnosis not present

## 2019-10-03 DIAGNOSIS — M1990 Unspecified osteoarthritis, unspecified site: Secondary | ICD-10-CM | POA: Diagnosis not present

## 2019-10-03 DIAGNOSIS — M810 Age-related osteoporosis without current pathological fracture: Secondary | ICD-10-CM | POA: Diagnosis not present

## 2019-10-03 DIAGNOSIS — H269 Unspecified cataract: Secondary | ICD-10-CM | POA: Diagnosis not present

## 2019-10-03 DIAGNOSIS — I10 Essential (primary) hypertension: Secondary | ICD-10-CM | POA: Diagnosis not present

## 2019-12-09 DIAGNOSIS — M1712 Unilateral primary osteoarthritis, left knee: Secondary | ICD-10-CM | POA: Diagnosis not present

## 2019-12-09 DIAGNOSIS — M1711 Unilateral primary osteoarthritis, right knee: Secondary | ICD-10-CM | POA: Diagnosis not present

## 2019-12-29 DIAGNOSIS — H269 Unspecified cataract: Secondary | ICD-10-CM | POA: Diagnosis not present

## 2019-12-29 DIAGNOSIS — M1990 Unspecified osteoarthritis, unspecified site: Secondary | ICD-10-CM | POA: Diagnosis not present

## 2019-12-29 DIAGNOSIS — M810 Age-related osteoporosis without current pathological fracture: Secondary | ICD-10-CM | POA: Diagnosis not present

## 2019-12-29 DIAGNOSIS — I10 Essential (primary) hypertension: Secondary | ICD-10-CM | POA: Diagnosis not present

## 2020-01-03 DIAGNOSIS — Z1231 Encounter for screening mammogram for malignant neoplasm of breast: Secondary | ICD-10-CM | POA: Diagnosis not present

## 2020-01-30 DIAGNOSIS — M810 Age-related osteoporosis without current pathological fracture: Secondary | ICD-10-CM | POA: Diagnosis not present

## 2020-02-21 DIAGNOSIS — M1990 Unspecified osteoarthritis, unspecified site: Secondary | ICD-10-CM | POA: Diagnosis not present

## 2020-02-21 DIAGNOSIS — I10 Essential (primary) hypertension: Secondary | ICD-10-CM | POA: Diagnosis not present

## 2020-02-21 DIAGNOSIS — M810 Age-related osteoporosis without current pathological fracture: Secondary | ICD-10-CM | POA: Diagnosis not present

## 2020-02-21 DIAGNOSIS — H269 Unspecified cataract: Secondary | ICD-10-CM | POA: Diagnosis not present

## 2020-03-29 DIAGNOSIS — H269 Unspecified cataract: Secondary | ICD-10-CM | POA: Diagnosis not present

## 2020-03-29 DIAGNOSIS — I10 Essential (primary) hypertension: Secondary | ICD-10-CM | POA: Diagnosis not present

## 2020-03-29 DIAGNOSIS — M810 Age-related osteoporosis without current pathological fracture: Secondary | ICD-10-CM | POA: Diagnosis not present

## 2020-03-29 DIAGNOSIS — M1990 Unspecified osteoarthritis, unspecified site: Secondary | ICD-10-CM | POA: Diagnosis not present

## 2020-04-10 DIAGNOSIS — S39012A Strain of muscle, fascia and tendon of lower back, initial encounter: Secondary | ICD-10-CM | POA: Diagnosis not present

## 2020-04-26 DIAGNOSIS — Z0000 Encounter for general adult medical examination without abnormal findings: Secondary | ICD-10-CM | POA: Diagnosis not present

## 2020-04-26 DIAGNOSIS — R8290 Unspecified abnormal findings in urine: Secondary | ICD-10-CM | POA: Diagnosis not present

## 2020-04-26 DIAGNOSIS — R7309 Other abnormal glucose: Secondary | ICD-10-CM | POA: Diagnosis not present

## 2020-04-26 DIAGNOSIS — R7303 Prediabetes: Secondary | ICD-10-CM | POA: Diagnosis not present

## 2020-04-26 DIAGNOSIS — I10 Essential (primary) hypertension: Secondary | ICD-10-CM | POA: Diagnosis not present

## 2020-04-26 DIAGNOSIS — M810 Age-related osteoporosis without current pathological fracture: Secondary | ICD-10-CM | POA: Diagnosis not present

## 2020-04-26 DIAGNOSIS — M1990 Unspecified osteoarthritis, unspecified site: Secondary | ICD-10-CM | POA: Diagnosis not present

## 2020-04-26 DIAGNOSIS — G2581 Restless legs syndrome: Secondary | ICD-10-CM | POA: Diagnosis not present

## 2020-04-26 DIAGNOSIS — N3020 Other chronic cystitis without hematuria: Secondary | ICD-10-CM | POA: Diagnosis not present

## 2020-05-28 DIAGNOSIS — H269 Unspecified cataract: Secondary | ICD-10-CM | POA: Diagnosis not present

## 2020-05-28 DIAGNOSIS — M1990 Unspecified osteoarthritis, unspecified site: Secondary | ICD-10-CM | POA: Diagnosis not present

## 2020-05-28 DIAGNOSIS — M810 Age-related osteoporosis without current pathological fracture: Secondary | ICD-10-CM | POA: Diagnosis not present

## 2020-05-28 DIAGNOSIS — I10 Essential (primary) hypertension: Secondary | ICD-10-CM | POA: Diagnosis not present

## 2020-06-14 DIAGNOSIS — M7051 Other bursitis of knee, right knee: Secondary | ICD-10-CM | POA: Diagnosis not present

## 2020-06-14 DIAGNOSIS — M1711 Unilateral primary osteoarthritis, right knee: Secondary | ICD-10-CM | POA: Diagnosis not present

## 2020-08-13 DIAGNOSIS — M810 Age-related osteoporosis without current pathological fracture: Secondary | ICD-10-CM | POA: Diagnosis not present

## 2020-08-13 DIAGNOSIS — H269 Unspecified cataract: Secondary | ICD-10-CM | POA: Diagnosis not present

## 2020-08-13 DIAGNOSIS — M1990 Unspecified osteoarthritis, unspecified site: Secondary | ICD-10-CM | POA: Diagnosis not present

## 2020-08-13 DIAGNOSIS — I10 Essential (primary) hypertension: Secondary | ICD-10-CM | POA: Diagnosis not present

## 2020-09-03 DIAGNOSIS — M810 Age-related osteoporosis without current pathological fracture: Secondary | ICD-10-CM | POA: Diagnosis not present

## 2020-09-17 DIAGNOSIS — D3132 Benign neoplasm of left choroid: Secondary | ICD-10-CM | POA: Diagnosis not present

## 2020-09-17 DIAGNOSIS — Z961 Presence of intraocular lens: Secondary | ICD-10-CM | POA: Diagnosis not present

## 2020-09-17 DIAGNOSIS — H02103 Unspecified ectropion of right eye, unspecified eyelid: Secondary | ICD-10-CM | POA: Diagnosis not present

## 2020-10-09 DIAGNOSIS — M25561 Pain in right knee: Secondary | ICD-10-CM | POA: Diagnosis not present

## 2020-10-13 DIAGNOSIS — M25561 Pain in right knee: Secondary | ICD-10-CM | POA: Diagnosis not present

## 2020-10-15 DIAGNOSIS — S83241D Other tear of medial meniscus, current injury, right knee, subsequent encounter: Secondary | ICD-10-CM | POA: Diagnosis not present

## 2020-10-15 DIAGNOSIS — H269 Unspecified cataract: Secondary | ICD-10-CM | POA: Diagnosis not present

## 2020-10-15 DIAGNOSIS — M1990 Unspecified osteoarthritis, unspecified site: Secondary | ICD-10-CM | POA: Diagnosis not present

## 2020-10-15 DIAGNOSIS — M1711 Unilateral primary osteoarthritis, right knee: Secondary | ICD-10-CM | POA: Diagnosis not present

## 2020-10-15 DIAGNOSIS — M810 Age-related osteoporosis without current pathological fracture: Secondary | ICD-10-CM | POA: Diagnosis not present

## 2020-10-15 DIAGNOSIS — I10 Essential (primary) hypertension: Secondary | ICD-10-CM | POA: Diagnosis not present

## 2020-10-22 DIAGNOSIS — M170 Bilateral primary osteoarthritis of knee: Secondary | ICD-10-CM | POA: Diagnosis not present

## 2020-10-23 ENCOUNTER — Other Ambulatory Visit: Payer: Self-pay | Admitting: Orthopaedic Surgery

## 2020-10-23 DIAGNOSIS — Z01818 Encounter for other preprocedural examination: Secondary | ICD-10-CM

## 2020-10-28 DIAGNOSIS — N390 Urinary tract infection, site not specified: Secondary | ICD-10-CM | POA: Diagnosis not present

## 2020-10-29 DIAGNOSIS — N390 Urinary tract infection, site not specified: Secondary | ICD-10-CM | POA: Diagnosis not present

## 2020-11-07 NOTE — Progress Notes (Signed)
**Note De-Identified [place] Obfuscation** DUE TO COVID-19 ONLY ONE VISITOR IS ALLOWED TO COME WITH YOU AND STAY IN THE WAITING ROOM ONLY DURING PRE OP AND PROCEDURE DAY OF SURGERY. THE 1 VISITOR  MAY VISIT WITH YOU AFTER SURGERY IN YOUR PRIVATE ROOM DURING VISITING HOURS ONLY!  YOU NEED TO HAVE A COVID 19 TEST ON___5/27/2022 ____ @_______ , THIS TEST MUST BE DONE BEFORE SURGERY,  COVID TESTING SITE [address] [place] [address], IT IS ON THE RIGHT GOING OUT [address] APPROXIMATELY  2 MINUTES PAST [employer] ON THE RIGHT. ONCE YOUR COVID TEST IS COMPLETED,  PLEASE BEGIN THE QUARANTINE INSTRUCTIONS AS OUTLINED IN YOUR HANDOUT.                Karen Dickerson  11/07/2020   Your procedure is scheduled on:  11/20/20   Report to [facility] Main  Entrance   Report to admitting at    1000 AM     Call this number if you have problems the morning of surgery [phone]    REMEMBER: NO  SOLID FOOD CANDY OR GUM AFTER MIDNIGHT. CLEAR LIQUIDS UNTIL  0930am        . NOTHING BY MOUTH EXCEPT CLEAR LIQUIDS UNTIL       0930am . PLEASE FINISH ENSURE DRINK PER SURGEON ORDER  WHICH NEEDS TO BE COMPLETED AT  0930am     .      CLEAR LIQUID DIET   Foods Allowed                                                                    Coffee and tea, regular and decaf                            Fruit ices (not with fruit pulp)                                      Iced Popsicles                                    Carbonated beverages, regular and diet                                    Cranberry, grape and apple juices Sports drinks like Gatorade Lightly seasoned clear broth or consume(fat free) Sugar, honey syrup ___________________________________________________________________      BRUSH YOUR TEETH MORNING OF SURGERY AND RINSE YOUR MOUTH OUT, NO CHEWING GUM CANDY OR MINTS.     Take these medicines the morning of surgery with A SIP OF WATER:                      Prilosec, amlodipine  DO NOT TAKE ANY DIABETIC  MEDICATIONS DAY OF YOUR SURGERY                               You may not have any **Note De-Identified Alejo Obfuscation** metal on your body including hair pins and              piercings  Do not wear jewelry, make-up, lotions, powders or perfumes, deodorant             Do not wear nail polish on your fingernails.  Do not shave  48 hours prior to surgery.              Men may shave face and neck.   Do not bring valuables to the hospital. [place].  Contacts, dentures or bridgework may not be worn into surgery.  Leave suitcase in the car. After surgery it may be brought to your room.     Patients discharged the day of surgery will not be allowed to drive home. IF YOU ARE HAVING SURGERY AND GOING HOME THE SAME DAY, YOU MUST HAVE AN ADULT TO DRIVE YOU HOME AND BE WITH YOU FOR 24 HOURS. YOU MAY GO HOME BY TAXI OR UBER OR ORTHERWISE, BUT AN ADULT MUST ACCOMPANY YOU HOME AND STAY WITH YOU FOR 24 HOURS.  Name and phone number of your driver:  Special Instructions: N/A              Please read over the following fact sheets you were given: _____________________________________________________________________  [facility] - Preparing for Surgery Before surgery, you can play an important role.  Because skin is not sterile, your skin needs to be as free of germs as possible.  You can reduce the number of germs on your skin by washing with CHG (chlorahexidine gluconate) soap before surgery.  CHG is an antiseptic cleaner which kills germs and bonds with the skin to continue killing germs even after washing. Please DO NOT use if you have an allergy to CHG or antibacterial soaps.  If your skin becomes reddened/irritated stop using the CHG and inform your nurse when you arrive at Short Stay. Do not shave (including legs and underarms) for at least 48 hours prior to the first CHG shower.  You may shave your face/neck. Please follow these instructions carefully:  1.  Shower with CHG Soap the night  before surgery and the  morning of Surgery.  2.  If you choose to wash your hair, wash your hair first as usual with your  normal  shampoo.  3.  After you shampoo, rinse your hair and body thoroughly to remove the  shampoo.                           4.  Use CHG as you would any other liquid soap.  You can apply chg directly  to the skin and wash                       Gently with a scrungie or clean washcloth.  5.  Apply the CHG Soap to your body ONLY FROM THE NECK DOWN.   Do not use on face/ open                           Wound or open sores. Avoid contact with eyes, ears mouth and genitals (private parts).                       Wash face, **Note De-Identified Vitali Obfuscation** Genitals (private parts) with your normal soap.             6.  Wash thoroughly, paying special attention to the area where your surgery  will be performed.  7.  Thoroughly rinse your body with warm water from the neck down.  8.  DO NOT shower/wash with your normal soap after using and rinsing off  the CHG Soap.                9.  Pat yourself dry with a clean towel.            10.  Wear clean pajamas.            11.  Place clean sheets on your bed the night of your first shower and do not  sleep with pets. Day of Surgery : Do not apply any lotions/deodorants the morning of surgery.  Please wear clean clothes to the hospital/surgery center.  FAILURE TO FOLLOW THESE INSTRUCTIONS MAY RESULT IN THE CANCELLATION OF YOUR SURGERY PATIENT SIGNATURE_________________________________  NURSE SIGNATURE__________________________________  ________________________________________________________________________

## 2020-11-12 ENCOUNTER — Encounter (HOSPITAL_COMMUNITY)
Admission: RE | Admit: 2020-11-12 | Discharge: 2020-11-12 | Disposition: A | Discharge status: Home / Self Care | Payer: PPO | Source: Ambulatory Visit | Attending: Orthopaedic Surgery | Admitting: Orthopaedic Surgery

## 2020-11-12 ENCOUNTER — Encounter (HOSPITAL_COMMUNITY): Payer: Self-pay

## 2020-11-12 ENCOUNTER — Other Ambulatory Visit: Payer: Self-pay

## 2020-11-12 ENCOUNTER — Ambulatory Visit (HOSPITAL_COMMUNITY)
Admission: RE | Admit: 2020-11-12 | Discharge: 2020-11-12 | Disposition: A | Discharge status: Home / Self Care | Payer: PPO | Source: Ambulatory Visit | Attending: Orthopaedic Surgery | Admitting: Orthopaedic Surgery

## 2020-11-12 DIAGNOSIS — Z01818 Encounter for other preprocedural examination: Secondary | ICD-10-CM | POA: Diagnosis not present

## 2020-11-12 DIAGNOSIS — M419 Scoliosis, unspecified: Secondary | ICD-10-CM | POA: Diagnosis not present

## 2020-11-12 DIAGNOSIS — R918 Other nonspecific abnormal finding of lung field: Secondary | ICD-10-CM | POA: Diagnosis not present

## 2020-11-12 HISTORY — DX: Nausea with vomiting, unspecified: Z98.890

## 2020-11-12 HISTORY — DX: Gastro-esophageal reflux disease without esophagitis: K21.9

## 2020-11-12 HISTORY — DX: Nausea with vomiting, unspecified: R11.2

## 2020-11-12 LAB — CBC WITH DIFFERENTIAL/PLATELET
Abs Immature Granulocytes: 0.03 10*3/uL (ref 0.00–0.07)
Basophils Absolute: 0 10*3/uL (ref 0.0–0.1)
Basophils Relative: 0 %
Eosinophils Absolute: 0 10*3/uL (ref 0.0–0.5)
Eosinophils Relative: 0 %
HCT: 44.6 % (ref 36.0–46.0)
Hemoglobin: 14.5 g/dL (ref 12.0–15.0)
Immature Granulocytes: 0 %
Lymphocytes Relative: 22 %
Lymphs Abs: 2.3 10*3/uL (ref 0.7–4.0)
MCH: 27.4 pg (ref 26.0–34.0)
MCHC: 32.5 g/dL (ref 30.0–36.0)
MCV: 84.3 fL (ref 80.0–100.0)
Monocytes Absolute: 1 10*3/uL (ref 0.1–1.0)
Monocytes Relative: 9 %
Neutro Abs: 7 10*3/uL (ref 1.7–7.7)
Neutrophils Relative %: 69 %
Platelets: 317 10*3/uL (ref 150–400)
RBC: 5.29 MIL/uL — ABNORMAL HIGH (ref 3.87–5.11)
RDW: 13.6 % (ref 11.5–15.5)
WBC: 10.3 10*3/uL (ref 4.0–10.5)
nRBC: 0 % (ref 0.0–0.2)

## 2020-11-12 LAB — URINALYSIS, ROUTINE W REFLEX MICROSCOPIC
Bilirubin Urine: NEGATIVE
Glucose, UA: NEGATIVE mg/dL
Ketones, ur: NEGATIVE mg/dL
Nitrite: NEGATIVE
Protein, ur: NEGATIVE mg/dL
Specific Gravity, Urine: 1.018 (ref 1.005–1.030)
pH: 6 (ref 5.0–8.0)

## 2020-11-12 LAB — BASIC METABOLIC PANEL
Anion gap: 5 (ref 5–15)
BUN: 18 mg/dL (ref 8–23)
CO2: 30 mmol/L (ref 22–32)
Calcium: 10.1 mg/dL (ref 8.9–10.3)
Chloride: 101 mmol/L (ref 98–111)
Creatinine, Ser: 0.82 mg/dL (ref 0.44–1.00)
GFR, Estimated: >60 mL/min (ref >60)
Glucose, Bld: 99 mg/dL (ref 70–99)
Potassium: 3.6 mmol/L (ref 3.5–5.1)
Sodium: 136 mmol/L (ref 135–145)

## 2020-11-12 LAB — PROTIME-INR
INR: 0.9 (ref 0.8–1.2)
Prothrombin Time: 12.3 seconds (ref 11.4–15.2)

## 2020-11-12 LAB — APTT: aPTT: 31 seconds (ref 24–36)

## 2020-11-12 LAB — SURGICAL PCR SCREEN
MRSA, PCR: NEGATIVE
Staphylococcus aureus: NEGATIVE

## 2020-11-12 NOTE — Progress Notes (Signed)
**Note De-Identified P[place] Obfuscation** U/A doone 11/12/20 routed to Dr Rhona Raider.

## 2020-11-12 NOTE — Progress Notes (Signed)
**Note De-Identified [place] Obfuscation** Anesthesia Review:  PCP: [facility] [employer]- Dr Zadie Rhine  Cardiologist : Chest x-ray :11/12/20 EKG :11/12/20  Echo : Stress test: Cardiac Cath :  Activity level: can do a flight of stairs without difficulty  Sleep Study/ CPAP : none  Fasting Blood Sugar :      / Checks Blood Sugar -- times a day:   Blood Thinner/ Instructions /Last Dose: ASA / Instructions/ Last Dose :

## 2020-11-12 NOTE — Progress Notes (Signed)
**Note De-Identified Erxleben Obfuscation** cxr done 11/12/20 routed to DR [facility] on 11/12/20.

## 2020-11-13 NOTE — Care Plan (Signed)
**Note De-Identified Demetriou Obfuscation** Ortho Bundle Case Management Note  Patient Details  Name: Karen Dickerson MRN: [id number] Date of Birth: [birth year]   spoke with patient prior to surgery. SHe will discharge to home with family to assist. Rolling walker ordered for home. HHPT referral to [facility] and OPPT set up with [facility]. Patient and MD in agreement with plan. Choice offered.                  DME Arranged:  Gilford Rile rolling DME Agency:  [facility]  HH Arranged:  PT HH Agency:  [facility]  Additional Comments: Please contact me with any questions of if this plan should need to change.  Ladell Heads,  [facility]  [phone] 11/13/2020, 11:29 AM

## 2020-11-15 DIAGNOSIS — R8290 Unspecified abnormal findings in urine: Secondary | ICD-10-CM | POA: Diagnosis not present

## 2020-11-15 DIAGNOSIS — G2581 Restless legs syndrome: Secondary | ICD-10-CM | POA: Diagnosis not present

## 2020-11-15 DIAGNOSIS — I10 Essential (primary) hypertension: Secondary | ICD-10-CM | POA: Diagnosis not present

## 2020-11-15 DIAGNOSIS — M810 Age-related osteoporosis without current pathological fracture: Secondary | ICD-10-CM | POA: Diagnosis not present

## 2020-11-15 DIAGNOSIS — M797 Fibromyalgia: Secondary | ICD-10-CM | POA: Diagnosis not present

## 2020-11-15 NOTE — H&P (Signed)
**Note De-Identified [place] Obfuscation** TOTAL KNEE ADMISSION H&P  Patient is being admitted for right total knee arthroplasty.  Subjective:  Chief Complaint:right knee pain.  HPI: Karen Dickerson, 80 y.o. female, has a history of pain and functional disability in the right knee due to arthritis and has failed non-surgical conservative treatments for greater than 12 weeks to includeNSAID's and/or analgesics, corticosteriod injections, flexibility and strengthening excercises, use of assistive devices, weight reduction as appropriate and activity modification.  Onset of symptoms was gradual, starting 5 years ago with gradually worsening course since that time. The patient noted no past surgery on the right knee(s).  Patient currently rates pain in the right knee(s) at 10 out of 10 with activity. Patient has night pain, worsening of pain with activity and weight bearing, pain that interferes with activities of daily living, crepitus and joint swelling.  Patient has evidence of subchondral cysts, subchondral sclerosis, periarticular osteophytes and joint space narrowing by imaging studies.  There is no active infection.  Patient Active Problem List   Diagnosis Date Noted  . Restless legs syndrome (RLS) 01/03/2013   Past Medical History:  Diagnosis Date  . Arthritis   . Bursitis   . Fibromyalgia   . GERD (gastroesophageal reflux disease)   . Hypertension   . Osteoporosis   . PONV (postoperative nausea and vomiting)   . Restless leg syndrome     Past Surgical History:  Procedure Laterality Date  . ABDOMINAL HYSTERECTOMY    . APPENDECTOMY    . CATARACT EXTRACTION  2013  . CHOLECYSTECTOMY    . COCCYX REMOVAL    . COLONOSCOPY    . EYE SURGERY     b/l  . NECK SURGERY     fusion  . SHOULDER ARTHROSCOPY     B/L  . TONSILLECTOMY    . TUBAL LIGATION      No current facility-administered medications for this encounter.   Current Outpatient Medications  Medication Sig Dispense Refill Last Dose  . acetaminophen (TYLENOL) 500  MG tablet Take 500 mg by mouth every 8 (eight) hours as needed for moderate pain.     Marland Kitchen amLODipine (NORVASC) 10 MG tablet Take 10 mg by mouth daily.     . meloxicam (MOBIC) 15 MG tablet Take 15 mg by mouth daily as needed for pain.     Marland Kitchen omeprazole (PRILOSEC) 20 MG capsule Take 20 mg by mouth daily.     . pramipexole (MIRAPEX) 0.125 MG tablet Take 0.125 mg by mouth 2 (two) times daily.     Marland Kitchen tiZANidine (ZANAFLEX) 2 MG tablet Take 2 mg by mouth every 6 (six) hours as needed for muscle spasms.     . traMADol (ULTRAM) 50 MG tablet Take 50 mg by mouth in the morning and at bedtime.     . triamterene-hydrochlorothiazide (MAXZIDE-25) 37.5-25 MG tablet Take 0.5 tablets by mouth every morning.     . trimethoprim (TRIMPEX) 100 MG tablet Take 100 mg by mouth See admin instructions. Take twice daily for 3 days as needed     . Turmeric Curcumin 500 MG CAPS Take 500 mg by mouth daily.      Allergies  Allergen Reactions  . Fish Oil Swelling  . Penicillins Swelling    Social History   Tobacco Use  . Smoking status: Never Smoker  . Smokeless tobacco: Never Used  Substance Use Topics  . Alcohol use: No    Family History  Problem Relation Age of Onset  . Stroke Mother   . Lung **Note De-Identified Bee Obfuscation** disease Father      Review of Systems  Musculoskeletal: Positive for arthralgias.       Right knee  All other systems reviewed and are negative.   Objective:  Physical Exam Constitutional:      Appearance: Normal appearance.  HENT:     Head: Normocephalic and atraumatic.     Nose: Nose normal.     Mouth/Throat:     Pharynx: Oropharynx is clear.  Eyes:     Extraocular Movements: Extraocular movements intact.  Cardiovascular:     Rate and Rhythm: Normal rate.     Pulses: Normal pulses.  Pulmonary:     Effort: Pulmonary effort is normal.  Abdominal:     Palpations: Abdomen is soft.  Musculoskeletal:     Cervical back: Normal range of motion.     Comments: Right knee motion is fairly good from 0-120.  She  has medial joint line pain and crepitation.  I do not feel an effusion.  I see no scars.  Hip motion is full and straight leg raise is negative.  She walks with an altered gait.    Skin:    General: Skin is warm and dry.  Neurological:     General: No focal deficit present.     Mental Status: She is alert and oriented to person, place, and time. Mental status is at baseline.  Psychiatric:        Mood and Affect: Mood normal.        Behavior: Behavior normal.        Thought Content: Thought content normal.        Judgment: Judgment normal.     Vital signs in last 24 hours:    Labs:   Estimated body mass index is 26.3 kg/m as calculated from the following:   Height as of 11/12/20: 5\' 1"  (1.549 m).   Weight as of 11/12/20: 63.1 kg.   Imaging Review Plain radiographs demonstrate severe degenerative joint disease of the right knee(s). The overall alignment isneutral. The bone quality appears to be good for age and reported activity level.      Assessment/Plan:  End stage primaryarthritis, right knee   The patient history, physical examination, clinical judgment of the provider and imaging studies are consistent with end stage degenerative joint disease of the right knee(s) and total knee arthroplasty is deemed medically necessary. The treatment options including medical management, injection therapy arthroscopy and arthroplasty were discussed at length. The risks and benefits of total knee arthroplasty were presented and reviewed. The risks due to aseptic loosening, infection, stiffness, patella tracking problems, thromboembolic complications and other imponderables were discussed. The patient acknowledged the explanation, agreed to proceed with the plan and consent was signed. Patient is being admitted for inpatient treatment for surgery, pain control, PT, OT, prophylactic antibiotics, VTE prophylaxis, progressive ambulation and ADL's and discharge planning. The patient is planning to  be discharged home with home health services   Patient's anticipated LOS is less than 2 midnights, meeting these requirements: - Younger than 1 - Lives within 1 hour of care - Has a competent adult at home to recover with post-op recover - NO history of  - Chronic pain requiring opiods  - Diabetes  - Coronary Artery Disease  - Heart failure  - Heart attack  - Stroke  - DVT/VTE  - Cardiac arrhythmia  - Respiratory Failure/COPD  - Renal failure  - Anemia  - Advanced Liver disease

## 2020-11-16 ENCOUNTER — Other Ambulatory Visit (HOSPITAL_COMMUNITY)
Admission: RE | Admit: 2020-11-16 | Discharge: 2020-11-16 | Disposition: A | Discharge status: Home / Self Care | Payer: PPO | Source: Ambulatory Visit | Attending: Orthopaedic Surgery | Admitting: Orthopaedic Surgery

## 2020-11-16 DIAGNOSIS — Z01812 Encounter for preprocedural laboratory examination: Secondary | ICD-10-CM | POA: Diagnosis not present

## 2020-11-16 DIAGNOSIS — Z20822 Contact with and (suspected) exposure to covid-19: Secondary | ICD-10-CM | POA: Diagnosis not present

## 2020-11-16 LAB — SARS CORONAVIRUS 2 (TAT 6-24 HRS): SARS Coronavirus 2: NEGATIVE

## 2020-11-19 MED ORDER — BUPIVACAINE LIPOSOME 1.3 % IJ SUSP
20.0000 mL | Freq: Once | INTRAMUSCULAR | Status: DC
  Filled 2020-11-19: qty 20

## 2020-11-19 MED ORDER — TRANEXAMIC ACID 1000 MG/10ML IV SOLN
2000.0000 mg | INTRAVENOUS | Status: DC
  Filled 2020-11-19: qty 20

## 2020-11-20 ENCOUNTER — Encounter (HOSPITAL_COMMUNITY): Payer: Self-pay | Admitting: Orthopaedic Surgery

## 2020-11-20 ENCOUNTER — Observation Stay (HOSPITAL_COMMUNITY)
Admission: RE | Admit: 2020-11-20 | Discharge: 2020-11-21 | Disposition: A | Discharge status: Home / Self Care | Payer: PPO | Attending: Orthopaedic Surgery | Admitting: Orthopaedic Surgery

## 2020-11-20 ENCOUNTER — Other Ambulatory Visit: Payer: Self-pay

## 2020-11-20 ENCOUNTER — Ambulatory Visit (HOSPITAL_COMMUNITY): Payer: PPO | Admitting: Physician Assistant

## 2020-11-20 ENCOUNTER — Ambulatory Visit (HOSPITAL_COMMUNITY): Payer: PPO | Admitting: Anesthesiology

## 2020-11-20 ENCOUNTER — Encounter (HOSPITAL_COMMUNITY)
Admission: RE | Disposition: A | Discharge status: Home / Self Care | Payer: Self-pay | Source: Home / Self Care | Attending: Orthopaedic Surgery

## 2020-11-20 DIAGNOSIS — I10 Essential (primary) hypertension: Secondary | ICD-10-CM | POA: Insufficient documentation

## 2020-11-20 DIAGNOSIS — K219 Gastro-esophageal reflux disease without esophagitis: Secondary | ICD-10-CM | POA: Diagnosis not present

## 2020-11-20 DIAGNOSIS — Z79899 Other long term (current) drug therapy: Secondary | ICD-10-CM | POA: Diagnosis not present

## 2020-11-20 DIAGNOSIS — M1711 Unilateral primary osteoarthritis, right knee: Principal | ICD-10-CM | POA: Insufficient documentation

## 2020-11-20 DIAGNOSIS — G8918 Other acute postprocedural pain: Secondary | ICD-10-CM | POA: Diagnosis not present

## 2020-11-20 HISTORY — PX: TOTAL KNEE ARTHROPLASTY: SHX125

## 2020-11-20 LAB — TYPE AND SCREEN
ABO/RH(D): B NEG
Antibody Screen: NEGATIVE

## 2020-11-20 LAB — ABO/RH: ABO/RH(D): B NEG

## 2020-11-20 SURGERY — ARTHROPLASTY, KNEE, TOTAL
Anesthesia: Monitor Anesthesia Care | Site: Knee | Laterality: Right

## 2020-11-20 MED ORDER — TRANEXAMIC ACID-NACL 1000-0.7 MG/100ML-% IV SOLN
1000.0000 mg | Freq: Once | INTRAVENOUS | Status: AC
  Administered 2020-11-20: 1000 mg via INTRAVENOUS
  Filled 2020-11-20: qty 100

## 2020-11-20 MED ORDER — BUPIVACAINE IN DEXTROSE 0.75-8.25 % IT SOLN
INTRATHECAL | Status: DC | PRN
  Administered 2020-11-20: 1.4 mL via INTRATHECAL

## 2020-11-20 MED ORDER — TRIMETHOPRIM 100 MG PO TABS: 100.0000 mg | ORAL_TABLET | ORAL | Status: DC

## 2020-11-20 MED ORDER — OXYCODONE HCL 5 MG PO TABS: 5.0000 mg | ORAL_TABLET | Freq: Once | ORAL | Status: AC | PRN

## 2020-11-20 MED ORDER — EPHEDRINE 5 MG/ML INJ
INTRAVENOUS | Status: AC
  Filled 2020-11-20: qty 10

## 2020-11-20 MED ORDER — VANCOMYCIN HCL 1000 MG/200ML IV SOLN
1000.0000 mg | Freq: Two times a day (BID) | INTRAVENOUS | Status: AC
  Administered 2020-11-20: 1000 mg via INTRAVENOUS
  Filled 2020-11-20: qty 200

## 2020-11-20 MED ORDER — FENTANYL CITRATE (PF) 100 MCG/2ML IJ SOLN
25.0000 ug | INTRAMUSCULAR | Status: DC | PRN
  Administered 2020-11-20: 25 ug via INTRAVENOUS
  Administered 2020-11-20: 50 ug via INTRAVENOUS

## 2020-11-20 MED ORDER — LACTATED RINGERS IV SOLN: INTRAVENOUS | Status: DC

## 2020-11-20 MED ORDER — POVIDONE-IODINE 10 % EX SWAB
2.0000 "application " | Freq: Once | CUTANEOUS | Status: AC
  Administered 2020-11-20: 2 via TOPICAL

## 2020-11-20 MED ORDER — DEXAMETHASONE SODIUM PHOSPHATE 10 MG/ML IJ SOLN
INTRAMUSCULAR | Status: DC | PRN
  Administered 2020-11-20: 5 mg via INTRAVENOUS

## 2020-11-20 MED ORDER — PRAMIPEXOLE DIHYDROCHLORIDE 0.25 MG PO TABS
0.1250 mg | ORAL_TABLET | Freq: Two times a day (BID) | ORAL | Status: DC
  Administered 2020-11-20 – 2020-11-21 (×2): 0.125 mg via ORAL
  Filled 2020-11-20 (×2): qty 0.5

## 2020-11-20 MED ORDER — ACETAMINOPHEN 500 MG PO TABS
500.0000 mg | ORAL_TABLET | Freq: Four times a day (QID) | ORAL | Status: DC
  Administered 2020-11-20 – 2020-11-21 (×3): 500 mg via ORAL
  Filled 2020-11-20 (×3): qty 1

## 2020-11-20 MED ORDER — METOCLOPRAMIDE HCL 5 MG/ML IJ SOLN: 5.0000 mg | Freq: Three times a day (TID) | INTRAMUSCULAR | Status: DC | PRN

## 2020-11-20 MED ORDER — METHOCARBAMOL 500 MG IVPB - SIMPLE MED
500.0000 mg | Freq: Four times a day (QID) | INTRAVENOUS | Status: DC | PRN
  Filled 2020-11-20: qty 50

## 2020-11-20 MED ORDER — MORPHINE SULFATE (PF) 2 MG/ML IV SOLN: 0.5000 mg | INTRAVENOUS | Status: DC | PRN

## 2020-11-20 MED ORDER — HYDROMORPHONE HCL 1 MG/ML IJ SOLN
0.2500 mg | INTRAMUSCULAR | Status: DC | PRN
  Administered 2020-11-20: 0.25 mg via INTRAVENOUS
  Administered 2020-11-20: 0.5 mg via INTRAVENOUS

## 2020-11-20 MED ORDER — ASPIRIN 81 MG PO CHEW
81.0000 mg | CHEWABLE_TABLET | Freq: Two times a day (BID) | ORAL | Status: DC
  Administered 2020-11-21: 81 mg via ORAL
  Filled 2020-11-20: qty 1

## 2020-11-20 MED ORDER — BUPIVACAINE-EPINEPHRINE (PF) 0.25% -1:200000 IJ SOLN
INTRAMUSCULAR | Status: AC
  Filled 2020-11-20: qty 30

## 2020-11-20 MED ORDER — BUPIVACAINE-EPINEPHRINE (PF) 0.25% -1:200000 IJ SOLN
INTRAMUSCULAR | Status: DC | PRN
  Administered 2020-11-20: 30 mL

## 2020-11-20 MED ORDER — PROPOFOL 10 MG/ML IV BOLUS
INTRAVENOUS | Status: DC | PRN
  Administered 2020-11-20 (×2): 10 mg via INTRAVENOUS

## 2020-11-20 MED ORDER — ONDANSETRON HCL 4 MG/2ML IJ SOLN: 4.0000 mg | Freq: Once | INTRAMUSCULAR | Status: DC | PRN

## 2020-11-20 MED ORDER — EPHEDRINE SULFATE-NACL 50-0.9 MG/10ML-% IV SOSY
PREFILLED_SYRINGE | INTRAVENOUS | Status: DC | PRN
  Administered 2020-11-20: 5 mg via INTRAVENOUS

## 2020-11-20 MED ORDER — SODIUM CHLORIDE 0.9% IV SOLUTION
INTRAVENOUS | Status: AC | PRN
  Administered 2020-11-20: 1000 mL via INTRAMUSCULAR

## 2020-11-20 MED ORDER — PHENOL 1.4 % MT LIQD: 1.0000 | OROMUCOSAL | Status: DC | PRN

## 2020-11-20 MED ORDER — DOCUSATE SODIUM 100 MG PO CAPS
100.0000 mg | ORAL_CAPSULE | Freq: Two times a day (BID) | ORAL | Status: DC
  Administered 2020-11-20 – 2020-11-21 (×2): 100 mg via ORAL
  Filled 2020-11-20 (×2): qty 1

## 2020-11-20 MED ORDER — BISACODYL 5 MG PO TBEC: 5.0000 mg | DELAYED_RELEASE_TABLET | Freq: Every day | ORAL | Status: DC | PRN

## 2020-11-20 MED ORDER — OXYCODONE HCL 5 MG PO TABS
ORAL_TABLET | ORAL | Status: AC
  Administered 2020-11-20: 5 mg via ORAL
  Filled 2020-11-20: qty 1

## 2020-11-20 MED ORDER — FENTANYL CITRATE (PF) 100 MCG/2ML IJ SOLN
INTRAMUSCULAR | Status: AC
  Administered 2020-11-20: 50 ug via INTRAVENOUS
  Filled 2020-11-20: qty 2

## 2020-11-20 MED ORDER — AMLODIPINE BESYLATE 10 MG PO TABS
10.0000 mg | ORAL_TABLET | Freq: Every day | ORAL | Status: DC
  Administered 2020-11-21: 10 mg via ORAL
  Filled 2020-11-20: qty 1

## 2020-11-20 MED ORDER — DEXAMETHASONE SODIUM PHOSPHATE 10 MG/ML IJ SOLN
INTRAMUSCULAR | Status: AC
  Filled 2020-11-20: qty 1

## 2020-11-20 MED ORDER — PANTOPRAZOLE SODIUM 40 MG PO TBEC
40.0000 mg | DELAYED_RELEASE_TABLET | Freq: Every day | ORAL | Status: DC
  Administered 2020-11-21: 40 mg via ORAL
  Filled 2020-11-20: qty 1

## 2020-11-20 MED ORDER — KETOROLAC TROMETHAMINE 15 MG/ML IJ SOLN
7.5000 mg | Freq: Four times a day (QID) | INTRAMUSCULAR | Status: DC
  Administered 2020-11-20 – 2020-11-21 (×3): 7.5 mg via INTRAVENOUS
  Filled 2020-11-20 (×3): qty 1

## 2020-11-20 MED ORDER — HYDROCODONE-ACETAMINOPHEN 5-325 MG PO TABS
1.0000 | ORAL_TABLET | ORAL | Status: DC | PRN
  Administered 2020-11-20: 2 via ORAL
  Administered 2020-11-21: 1 via ORAL
  Administered 2020-11-21: 2 via ORAL
  Filled 2020-11-20 (×2): qty 2
  Filled 2020-11-20: qty 1

## 2020-11-20 MED ORDER — FENTANYL CITRATE (PF) 100 MCG/2ML IJ SOLN
INTRAMUSCULAR | Status: AC
  Administered 2020-11-20: 25 ug via INTRAVENOUS
  Filled 2020-11-20: qty 2

## 2020-11-20 MED ORDER — HYDROMORPHONE HCL 1 MG/ML IJ SOLN
INTRAMUSCULAR | Status: AC
  Administered 2020-11-20: 0.25 mg via INTRAVENOUS
  Filled 2020-11-20: qty 1

## 2020-11-20 MED ORDER — ONDANSETRON HCL 4 MG PO TABS: 4.0000 mg | ORAL_TABLET | Freq: Four times a day (QID) | ORAL | Status: DC | PRN

## 2020-11-20 MED ORDER — ACETAMINOPHEN 500 MG PO TABS
1000.0000 mg | ORAL_TABLET | Freq: Once | ORAL | Status: AC
  Administered 2020-11-20: 1000 mg via ORAL
  Filled 2020-11-20: qty 2

## 2020-11-20 MED ORDER — MENTHOL 3 MG MT LOZG: 1.0000 | LOZENGE | OROMUCOSAL | Status: DC | PRN

## 2020-11-20 MED ORDER — SODIUM CHLORIDE 0.9 % IV SOLN
INTRAVENOUS | Status: DC | PRN
  Administered 2020-11-20: 2000 mg via TOPICAL

## 2020-11-20 MED ORDER — FENTANYL CITRATE (PF) 100 MCG/2ML IJ SOLN
50.0000 ug | INTRAMUSCULAR | Status: DC
  Administered 2020-11-20: 100 ug via INTRAVENOUS
  Filled 2020-11-20: qty 2

## 2020-11-20 MED ORDER — MIDAZOLAM HCL 2 MG/2ML IJ SOLN
1.0000 mg | INTRAMUSCULAR | Status: DC
  Administered 2020-11-20: 1 mg via INTRAVENOUS
  Filled 2020-11-20: qty 2

## 2020-11-20 MED ORDER — METOCLOPRAMIDE HCL 5 MG PO TABS: 5.0000 mg | ORAL_TABLET | Freq: Three times a day (TID) | ORAL | Status: DC | PRN

## 2020-11-20 MED ORDER — LIDOCAINE 2% (20 MG/ML) 5 ML SYRINGE
INTRAMUSCULAR | Status: DC | PRN
  Administered 2020-11-20: 20 mg via INTRAVENOUS

## 2020-11-20 MED ORDER — OXYCODONE HCL 5 MG/5ML PO SOLN
5.0000 mg | Freq: Once | ORAL | Status: AC | PRN
Start: 2020-11-20 — End: 2020-11-20

## 2020-11-20 MED ORDER — DIPHENHYDRAMINE HCL 12.5 MG/5ML PO ELIX: 12.5000 mg | ORAL_SOLUTION | ORAL | Status: DC | PRN

## 2020-11-20 MED ORDER — HYDROCODONE-ACETAMINOPHEN 7.5-325 MG PO TABS: 1.0000 | ORAL_TABLET | ORAL | Status: DC | PRN

## 2020-11-20 MED ORDER — ALUM & MAG HYDROXIDE-SIMETH 200-200-20 MG/5ML PO SUSP: 30.0000 mL | ORAL | Status: DC | PRN

## 2020-11-20 MED ORDER — VANCOMYCIN HCL IN DEXTROSE 1-5 GM/200ML-% IV SOLN
1000.0000 mg | INTRAVENOUS | Status: AC
  Administered 2020-11-20: 1000 mg via INTRAVENOUS
  Filled 2020-11-20: qty 200

## 2020-11-20 MED ORDER — METHOCARBAMOL 500 MG PO TABS
500.0000 mg | ORAL_TABLET | Freq: Four times a day (QID) | ORAL | Status: DC | PRN
  Administered 2020-11-21 (×2): 500 mg via ORAL
  Filled 2020-11-20 (×2): qty 1

## 2020-11-20 MED ORDER — BUPIVACAINE HCL (PF) 0.5 % IJ SOLN
INTRAMUSCULAR | Status: DC | PRN
  Administered 2020-11-20: 20 mL via PERINEURAL

## 2020-11-20 MED ORDER — PHENYLEPHRINE 40 MCG/ML (10ML) SYRINGE FOR IV PUSH (FOR BLOOD PRESSURE SUPPORT)
PREFILLED_SYRINGE | INTRAVENOUS | Status: DC | PRN
  Administered 2020-11-20 (×2): 40 ug via INTRAVENOUS

## 2020-11-20 MED ORDER — ONDANSETRON HCL 4 MG/2ML IJ SOLN: 4.0000 mg | Freq: Four times a day (QID) | INTRAMUSCULAR | Status: DC | PRN

## 2020-11-20 MED ORDER — METHOCARBAMOL 500 MG IVPB - SIMPLE MED
INTRAVENOUS | Status: AC
  Administered 2020-11-20: 500 mg via INTRAVENOUS
  Filled 2020-11-20: qty 50

## 2020-11-20 MED ORDER — BUPIVACAINE LIPOSOME 1.3 % IJ SUSP
INTRAMUSCULAR | Status: DC | PRN
  Administered 2020-11-20: 20 mL

## 2020-11-20 MED ORDER — PROPOFOL 500 MG/50ML IV EMUL
INTRAVENOUS | Status: DC | PRN
  Administered 2020-11-20: 50 ug/kg/min via INTRAVENOUS

## 2020-11-20 MED ORDER — ACETAMINOPHEN 325 MG PO TABS: 325.0000 mg | ORAL_TABLET | Freq: Four times a day (QID) | ORAL | Status: DC | PRN

## 2020-11-20 MED ORDER — PROPOFOL 500 MG/50ML IV EMUL
INTRAVENOUS | Status: AC
  Filled 2020-11-20: qty 150

## 2020-11-20 MED ORDER — SODIUM CHLORIDE (PF) 0.9 % IJ SOLN
INTRAMUSCULAR | Status: AC
  Filled 2020-11-20: qty 30

## 2020-11-20 MED ORDER — TRANEXAMIC ACID-NACL 1000-0.7 MG/100ML-% IV SOLN
1000.0000 mg | INTRAVENOUS | Status: AC
  Administered 2020-11-20: 1000 mg via INTRAVENOUS
  Filled 2020-11-20: qty 100

## 2020-11-20 MED ORDER — LIDOCAINE 2% (20 MG/ML) 5 ML SYRINGE
INTRAMUSCULAR | Status: AC
  Filled 2020-11-20: qty 5

## 2020-11-20 MED ORDER — 0.9 % SODIUM CHLORIDE (POUR BTL) OPTIME
TOPICAL | Status: DC | PRN
  Administered 2020-11-20: 1000 mL

## 2020-11-20 MED ORDER — SODIUM CHLORIDE (PF) 0.9 % IJ SOLN
INTRAMUSCULAR | Status: DC | PRN
  Administered 2020-11-20: 30 mL

## 2020-11-20 MED ORDER — TRIAMTERENE-HCTZ 37.5-25 MG PO TABS
0.5000 | ORAL_TABLET | Freq: Every morning | ORAL | Status: DC
  Administered 2020-11-21: 0.5 via ORAL
  Filled 2020-11-20: qty 1

## 2020-11-20 SURGICAL SUPPLY — 54 items
ATTUNE MED DOME PAT 32 KNEE (Knees) ×1 IMPLANT
ATTUNE PSFEM RTSZ4 NARCEM KNEE (Femur) ×1 IMPLANT
ATTUNE PSRP INSR SZ4 5 KNEE (Insert) ×1 IMPLANT
BAG DECANTER FOR FLEXI CONT (MISCELLANEOUS) ×2 IMPLANT
BAG SPEC THK2 15X12 ZIP CLS (MISCELLANEOUS) ×1
BAG ZIPLOCK 12X15 (MISCELLANEOUS) ×2 IMPLANT
BASEPLATE TIBIAL ROTATING SZ 4 (Knees) ×1 IMPLANT
BLADE SAGITTAL 25.0X1.19X90 (BLADE) ×2 IMPLANT
BLADE SAW SGTL 11.0X1.19X90.0M (BLADE) ×2 IMPLANT
BNDG ELASTIC 6X5.8 VLCR STR LF (GAUZE/BANDAGES/DRESSINGS) ×2 IMPLANT
BOOTIES KNEE HIGH SLOAN (MISCELLANEOUS) ×2 IMPLANT
BOWL SMART MIX CTS (DISPOSABLE) ×2 IMPLANT
BSPLAT TIB 4 CMNT ROT PLAT STR (Knees) ×1 IMPLANT
CEMENT HV SMART SET (Cement) ×4 IMPLANT
COVER SURGICAL LIGHT HANDLE (MISCELLANEOUS) ×2 IMPLANT
COVER WAND RF STERILE (DRAPES) ×2 IMPLANT
CUFF TOURN SGL QUICK 34 (TOURNIQUET CUFF) ×2
CUFF TRNQT CYL 34X4.125X (TOURNIQUET CUFF) ×1 IMPLANT
DECANTER SPIKE VIAL GLASS SM (MISCELLANEOUS) ×4 IMPLANT
DRAPE ORTHO SPLIT 77X108 STRL (DRAPES)
DRAPE SHEET LG 3/4 BI-LAMINATE (DRAPES) ×2 IMPLANT
DRAPE SURG ORHT 6 SPLT 77X108 (DRAPES) IMPLANT
DRAPE TOP 10253 STERILE (DRAPES) ×2 IMPLANT
DRAPE U-SHAPE 47X51 STRL (DRAPES) ×2 IMPLANT
DRSG AQUACEL AG ADV 3.5X10 (GAUZE/BANDAGES/DRESSINGS) ×2 IMPLANT
DURAPREP 26ML APPLICATOR (WOUND CARE) ×4 IMPLANT
ELECT REM PT RETURN 15FT ADLT (MISCELLANEOUS) ×2 IMPLANT
GLOVE SRG 8 PF TXTR STRL LF DI (GLOVE) ×2 IMPLANT
GLOVE SURG ENC MOIS LTX SZ8 (GLOVE) ×4 IMPLANT
GLOVE SURG UNDER POLY LF SZ8 (GLOVE) ×4
GOWN STRL REUS W/TWL XL LVL3 (GOWN DISPOSABLE) ×4 IMPLANT
HANDPIECE INTERPULSE COAX TIP (DISPOSABLE) ×2
HOLDER FOLEY CATH W/STRAP (MISCELLANEOUS) IMPLANT
HOOD PEEL AWAY FLYTE STAYCOOL (MISCELLANEOUS) ×6 IMPLANT
KIT TURNOVER KIT A (KITS) ×2 IMPLANT
MANIFOLD NEPTUNE II (INSTRUMENTS) ×2 IMPLANT
NEEDLE HYPO 22GX1.5 SAFETY (NEEDLE) ×4 IMPLANT
NS IRRIG 1000ML POUR BTL (IV SOLUTION) ×2 IMPLANT
PACK TOTAL KNEE CUSTOM (KITS) ×2 IMPLANT
PAD ARMBOARD 7.5X6 YLW CONV (MISCELLANEOUS) ×2 IMPLANT
PENCIL SMOKE EVACUATOR (MISCELLANEOUS) IMPLANT
PIN DRILL FIX HALF THREAD (BIT) ×1 IMPLANT
PIN STEINMAN FIXATION KNEE (PIN) ×1 IMPLANT
PROTECTOR NERVE ULNAR (MISCELLANEOUS) ×2 IMPLANT
SET HNDPC FAN SPRY TIP SCT (DISPOSABLE) ×1 IMPLANT
SUT ETHIBOND NAB CT1 #1 30IN (SUTURE) ×4 IMPLANT
SUT VIC AB 0 CT1 36 (SUTURE) ×2 IMPLANT
SUT VIC AB 2-0 CT1 27 (SUTURE) ×2
SUT VIC AB 2-0 CT1 TAPERPNT 27 (SUTURE) ×1 IMPLANT
SUT VICRYL AB 3-0 FS1 BRD 27IN (SUTURE) ×2 IMPLANT
SUT VLOC 180 0 24IN GS25 (SUTURE) ×2 IMPLANT
TRAY FOLEY MTR SLVR 16FR STAT (SET/KITS/TRAYS/PACK) IMPLANT
WATER STERILE IRR 1000ML POUR (IV SOLUTION) ×2 IMPLANT
WRAP KNEE MAXI GEL POST OP (GAUZE/BANDAGES/DRESSINGS) ×2 IMPLANT

## 2020-11-20 NOTE — Anesthesia Procedure Notes (Signed)
**Note De-Identified C[place]ney Obfuscation** Anesthesia Regional Block: Adductor canal block   Pre-Anesthetic Checklist: ,, timeout performed, Correct Patient, Correct Site, Correct Laterality, Correct Procedure, Correct Position, site marked, Risks and benefits discussed,  Surgical consent,  Pre-op evaluation,  At surgeon's request and post-op pain management  Laterality: Right  Prep: chloraprep       Needles:  Injection technique: Single-shot  Needle Type: Echogenic Stimulator Needle     Needle Length: 10cm  Needle Gauge: 20     Additional Needles:   Procedures:,,,, ultrasound used (permanent image in chart),,,,  Narrative:  Start time: 11/20/2020 11:00 AM End time: 11/20/2020 11:05 AM Injection made incrementally with aspirations every 5 mL.  Performed by: Personally  Anesthesiologist: Merlinda Frederick, MD  Additional Notes: A functioning IV was confirmed and monitors were applied.  Sterile prep and drape, hand hygiene and sterile gloves were used.  Negative aspiration and test dose prior to incremental administration of local anesthetic. The patient tolerated the procedure well.Ultrasound  guidance: relevant anatomy identified, needle position confirmed, local anesthetic spread visualized around nerve(s), vascular puncture avoided.  Image printed for medical record.

## 2020-11-20 NOTE — Anesthesia Preprocedure Evaluation (Addendum)
**Note De-Identified [place] Obfuscation** Anesthesia Evaluation  Patient identified by MRN, date of birth, ID band Patient awake    History of Anesthesia Complications (+) PONV and history of anesthetic complications  Airway Mallampati: II  TM Distance: >3 FB Neck ROM: Full    Dental no notable dental hx.    Pulmonary neg pulmonary ROS,    Pulmonary exam normal breath sounds clear to auscultation       Cardiovascular hypertension, Pt. on medications Normal cardiovascular exam Rhythm:Regular Rate:Normal     Neuro/Psych    GI/Hepatic Neg liver ROS, GERD  ,  Endo/Other  negative endocrine ROS  Renal/GU negative Renal ROS  negative genitourinary   Musculoskeletal  (+) Arthritis , Osteoarthritis,  Fibromyalgia -  Abdominal   Peds negative pediatric ROS (+)  Hematology negative hematology ROS (+)   Anesthesia Other Findings   Reproductive/Obstetrics                            Anesthesia Physical Anesthesia Plan  ASA: II  Anesthesia Plan: MAC, Regional and Spinal   Post-op Pain Management:  Regional for Post-op pain   Induction: Intravenous  PONV Risk Score and Plan: 3 and Treatment may vary due to age or medical condition, Ondansetron, Propofol infusion and TIVA  Airway Management Planned: Natural Airway and Simple Face Mask  Additional Equipment: None  Intra-op Plan:   Post-operative Plan: Extubation in OR  Informed Consent: I have reviewed the patients History and Physical, chart, labs and discussed the procedure including the risks, benefits and alternatives for the proposed anesthesia with the patient or authorized representative who has indicated his/her understanding and acceptance.     Dental advisory given  Plan Discussed with: CRNA, Anesthesiologist and Surgeon  Anesthesia Plan Comments: (Spinal. Adductor block for pain control. GA/LMA as backup plan. Norton Blizzard, MD  )       Anesthesia Quick  Evaluation

## 2020-11-20 NOTE — Progress Notes (Signed)
**Note De-Identified Warren Obfuscation** AssistedDr. Elgie Congo with right, ultrasound guided, adductor canal block. Side rails up, monitors on throughout procedure. See vital signs in flow sheet. Tolerated Procedure well.

## 2020-11-20 NOTE — Anesthesia Procedure Notes (Signed)
**Note De-Identified [place]king Obfuscation** Spinal  Patient location during procedure: OR Start time: 11/20/2020 12:55 PM End time: 11/20/2020 1:00 PM Reason for block: surgical anesthesia Staffing Performed: resident/CRNA  Anesthesiologist: Merlinda Frederick, MD Resident/CRNA: Gwyndolyn Saxon, CRNA Preanesthetic Checklist Completed: patient identified, IV checked, site marked, risks and benefits discussed, surgical consent, monitors and equipment checked, pre-op evaluation and timeout performed Spinal Block Patient position: sitting Prep: DuraPrep Patient monitoring: heart rate, continuous pulse ox and blood pressure Approach: midline Location: L3-4 Injection technique: single-shot Needle Needle type: Pencan  Needle gauge: 24 G Needle length: 10 cm Assessment Sensory level: T8 Events: CSF return

## 2020-11-20 NOTE — Op Note (Signed)
**Note De-Identified [place] Obfuscation** PREOP DIAGNOSIS: DJD RIGHT KNEE POSTOP DIAGNOSIS: same PROCEDURE: RIGHT TKR ANESTHESIA: Spinal and MAC ATTENDING SURGEON: Hessie Dibble ASSISTANT: Loni Dolly PA  INDICATIONS FOR PROCEDURE: Karen Dickerson is a 80 y.o. female who has struggled for a long time with pain due to degenerative arthritis of the right knee.  The patient has failed many conservative non-operative measures and at this point has pain which limits the ability to sleep and walk.  The patient is offered total knee replacement.  Informed operative consent was obtained after discussion of possible risks of anesthesia, infection, neurovascular injury, DVT, and death.  The importance of the post-operative rehabilitation protocol to optimize result was stressed extensively with the patient.  SUMMARY OF FINDINGS AND PROCEDURE:  [name] was taken to the operative suite where under the above anesthesia a right knee replacement was performed.  There were advanced degenerative changes and the bone quality was good.  We used the DePuy Attune system and placed size 4 narrow femur, 4 tibia, 35 mm all polyethylene patella, and a size 5 mm spacer.  Loni Dolly PA-C assisted throughout and was invaluable to the completion of the case in that he helped retract and maintain exposure while I placed components.  He also helped close thereby minimizing OR time.  The patient was admitted for appropriate post-op care to include perioperative antibiotics and mechanical and pharmacologic measures for DVT prophylaxis.  DESCRIPTION OF PROCEDURE:  [name] was taken to the operative suite where the above anesthesia was applied.  The patient was positioned supine and prepped and draped in normal sterile fashion.  An appropriate time out was performed.  After the administration of vancomycin pre-op antibiotic the leg was elevated and exsanguinated and a tourniquet inflated. A standard longitudinal incision was made on the anterior knee.   Dissection was carried down to the extensor mechanism.  All appropriate anti-infective measures were used including the pre-operative antibiotic, betadine impregnated drape, and closed hooded exhaust systems for each member of the surgical team.  A medial parapatellar incision was made in the extensor mechanism and the knee cap flipped and the knee flexed.  Some residual meniscal tissues were removed along with any remaining ACL/PCL tissue.  A guide was placed on the tibia and a flat cut was made on it's superior surface.  An intramedullary guide was placed in the femur and was utilized to make anterior and posterior cuts creating an appropriate flexion gap.  A second intramedullary guide was placed in the femur to make a distal cut properly balancing the knee with an extension gap equal to the flexion gap.  The three bones sized to the above mentioned sizes and the appropriate guides were placed and utilized.  A trial reduction was done and the knee easily came to full extension and the patella tracked well on flexion.  The trial components were removed and all bones were cleaned with pulsatile lavage and then dried thoroughly.  Cement was mixed and was pressurized onto the bones followed by placement of the aforementioned components.  Excess cement was trimmed and pressure was held on the components until the cement had hardened.  The tourniquet was deflated and a small amount of bleeding was controlled with cautery and pressure.  The knee was irrigated thoroughly.  The extensor mechanism was re-approximated with #1 ethibond in interrupted fashion.  The knee was flexed and the repair was solid.  The subcutaneous tissues were re-approximated with #0 and #2-0 vicryl and the skin closed **Note De-Identified Carias Obfuscation** with a subcuticular stitch and steristrips.  A sterile dressing was applied.  Intraoperative fluids, EBL, and tourniquet time can be obtained from anesthesia records.  DISPOSITION:  The patient was taken to recovery room in stable  condition and admitted for appropriate post-op care to include peri-operative antibiotic and DVT prophylaxis with mechanical and pharmacologic measures.  Hessie Dibble 11/20/2020, 2:49 PM

## 2020-11-20 NOTE — Interval H&P Note (Signed)
**Note De-Identified [place]illano Obfuscation** History and Physical Interval Note:  11/20/2020 12:04 PM  Karen Dickerson  has presented today for surgery, with the diagnosis of [place].  The various methods of treatment have been discussed with the patient and family. After consideration of risks, benefits and other options for treatment, the patient has consented to  Procedure(s): RIGHT TOTAL KNEE ARTHROPLASTY (Right) as a surgical intervention.  The patient's history has been reviewed, patient examined, no change in status, stable for surgery.  I have reviewed the patient's chart and labs.  Questions were answered to the patient's satisfaction.     Hessie Dibble

## 2020-11-20 NOTE — Transfer of Care (Signed)
**Note De-Identified Loredo Obfuscation** Immediate Anesthesia Transfer of Care Note  Patient: Karen Dickerson  Procedure(s) Performed: RIGHT TOTAL KNEE ARTHROPLASTY (Right Knee)  Patient Location: PACU  Anesthesia Type:Spinal  Level of Consciousness: awake  Airway & Oxygen Therapy: Patient Spontanous Breathing  Post-op Assessment: Report given to RN and Post -op Vital signs reviewed and stable  Post vital signs: Reviewed and stable  Last Vitals:  Vitals Value Taken Time  BP 107/66 11/20/20 1505  Temp    Pulse 71 11/20/20 1506  Resp 22 11/20/20 1506  SpO2 100 % 11/20/20 1506  Vitals shown include unvalidated device data.  Last Pain:  Vitals:   11/20/20 1035  TempSrc: Oral         Complications: No complications documented.

## 2020-11-20 NOTE — Anesthesia Postprocedure Evaluation (Signed)
**Note De-Identified Wegmann Obfuscation** Anesthesia Post Note  Patient: Karen Dickerson  Procedure(s) Performed: RIGHT TOTAL KNEE ARTHROPLASTY (Right Knee)     Patient location during evaluation: PACU Anesthesia Type: Regional, Spinal and MAC Level of consciousness: sedated and patient cooperative Pain management: pain level controlled Vital Signs Assessment: post-procedure vital signs reviewed and stable Respiratory status: spontaneous breathing Cardiovascular status: stable Anesthetic complications: no   No complications documented.  Last Vitals:  Vitals:   11/20/20 1630 11/20/20 1645  BP: 118/63 111/74  Pulse: 80 79  Resp: 14 20  Temp:  (!) 36.4 C  SpO2: 98% 98%    Last Pain:  Vitals:   11/20/20 1645  TempSrc:   PainSc: Asleep                 Nolon Nations

## 2020-11-21 ENCOUNTER — Encounter (HOSPITAL_COMMUNITY): Payer: Self-pay | Admitting: Orthopaedic Surgery

## 2020-11-21 DIAGNOSIS — M1711 Unilateral primary osteoarthritis, right knee: Secondary | ICD-10-CM | POA: Diagnosis not present

## 2020-11-21 DIAGNOSIS — Z96652 Presence of left artificial knee joint: Secondary | ICD-10-CM | POA: Diagnosis not present

## 2020-11-21 DIAGNOSIS — M1712 Unilateral primary osteoarthritis, left knee: Secondary | ICD-10-CM | POA: Diagnosis not present

## 2020-11-21 MED ORDER — ASPIRIN 81 MG PO CHEW: 81.0000 mg | CHEWABLE_TABLET | Freq: Two times a day (BID) | ORAL | 0 refills | Status: DC

## 2020-11-21 MED ORDER — HYDROCODONE-ACETAMINOPHEN 5-325 MG PO TABS: 1.0000 | ORAL_TABLET | Freq: Four times a day (QID) | ORAL | 0 refills | Status: AC | PRN

## 2020-11-21 MED ORDER — TIZANIDINE HCL 4 MG PO TABS: 4.0000 mg | ORAL_TABLET | Freq: Four times a day (QID) | ORAL | 1 refills | Status: AC | PRN

## 2020-11-21 NOTE — Discharge Summary (Signed)
**Note De-Identified Jenkin Obfuscation** Patient ID: Karen Dickerson MRN: [id number] DOB/AGE: 80-May-1942 2 y.o.  Admit date: 11/20/2020 Discharge date: 11/21/2020  Admission Diagnoses:  Principal Problem:   Primary osteoarthritis of right knee   Discharge Diagnoses:  Same  Past Medical History:  Diagnosis Date  . Arthritis   . Bursitis   . Fibromyalgia   . GERD (gastroesophageal reflux disease)   . Hypertension   . Osteoporosis   . PONV (postoperative nausea and vomiting)   . Restless leg syndrome     Surgeries: Procedure(s): RIGHT TOTAL KNEE ARTHROPLASTY on 11/20/2020   Consultants:   Discharged Condition: Improved  Hospital Course: Karen Dickerson is an 80 y.o. female who was admitted 11/20/2020 for operative treatment ofPrimary osteoarthritis of right knee. Patient has severe unremitting pain that affects sleep, daily activities, and work/hobbies. After pre-op clearance the patient was taken to the operating room on 11/20/2020 and underwent  Procedure(s): RIGHT TOTAL KNEE ARTHROPLASTY.    Patient was given perioperative antibiotics:  Anti-infectives (From admission, onward)   Start     Dose/Rate Route Frequency Ordered Stop   11/20/20 2300  vancomycin (VANCOREADY) IVPB 1000 mg/200 mL        1,000 mg 200 mL/hr over 60 Minutes Intravenous Every 12 hours 11/20/20 1714 11/20/20 2357   11/20/20 1714  trimethoprim (TRIMPEX) tablet 100 mg  Status:  Discontinued        100 mg Oral See admin instructions 11/20/20 1714 11/20/20 1726   11/20/20 1030  vancomycin (VANCOCIN) IVPB 1000 mg/200 mL premix        1,000 mg 200 mL/hr over 60 Minutes Intravenous On call to O.R. 11/20/20 1019 11/20/20 1215       Patient was given sequential compression devices, early ambulation, and chemoprophylaxis to prevent DVT.  Patient benefited maximally from hospital stay and there were no complications.    Recent vital signs:  Patient Vitals for the past 24 hrs:  BP Temp Temp src Pulse Resp SpO2  11/21/20 0510 119/70 98.2 F (36.8  C) Oral 68 16 97 %  11/21/20 0101 132/80 97.6 F (36.4 C) Oral 73 16 98 %  11/20/20 2041 123/61 97.9 F (36.6 C) Oral 79 17 98 %  11/20/20 1854 121/75 97.6 F (36.4 C) Oral 85 16 96 %  11/20/20 1709 106/67 97.8 F (36.6 C) -- 82 16 97 %  11/20/20 1645 111/74 (!) 97.5 F (36.4 C) -- 79 20 98 %  11/20/20 1630 118/63 -- -- 80 14 98 %  11/20/20 1615 123/72 -- -- 77 18 97 %  11/20/20 1600 119/77 -- -- 75 19 96 %  11/20/20 1545 116/65 -- -- 76 12 95 %  11/20/20 1530 115/74 -- -- 76 16 94 %  11/20/20 1515 106/65 -- -- 70 16 96 %  11/20/20 1506 107/66 (!) 97.5 F (36.4 C) -- 71 15 100 %  11/20/20 1145 119/74 -- -- 73 13 98 %  11/20/20 1140 124/74 -- -- 68 11 98 %  11/20/20 1135 116/73 -- -- 71 11 98 %  11/20/20 1130 112/73 -- -- 76 10 97 %  11/20/20 1125 116/73 -- -- 73 11 98 %  11/20/20 1120 115/71 -- -- 74 (!) 9 98 %  11/20/20 1115 99/75 -- -- 75 (!) 9 98 %  11/20/20 1110 108/66 -- -- 73 13 95 %  11/20/20 1109 -- -- -- 78 14 96 %  11/20/20 1108 -- -- -- 78 12 97 %  11/20/20 1107 -- -- -- 77 **Note De-Identified [place]ance Obfuscation** 16 98 %  11/20/20 1106 -- -- -- 79 12 98 %  11/20/20 1105 116/78 -- -- 77 14 97 %  11/20/20 1035 132/80 98.1 F (36.7 C) Oral 84 20 96 %     Recent laboratory studies: No results for input(s): WBC, HGB, HCT, PLT, NA, K, CL, CO2, BUN, CREATININE, GLUCOSE, INR, CALCIUM in the last 72 hours.  Invalid input(s): PT, 2   Discharge Medications:   Allergies as of 11/21/2020      Reactions   Fish Oil Swelling   Penicillins Swelling      Medication List    STOP taking these medications   meloxicam 15 MG tablet Commonly known as: MOBIC     TAKE these medications   acetaminophen 500 MG tablet Commonly known as: TYLENOL Take 500 mg by mouth every 8 (eight) hours as needed for moderate pain.   amLODipine 10 MG tablet Commonly known as: NORVASC Take 10 mg by mouth daily.   aspirin 81 MG chewable tablet Chew 1 tablet (81 mg total) by mouth 2 (two) times daily.    HYDROcodone-acetaminophen 5-325 MG tablet Commonly known as: NORCO/VICODIN Take 1-2 tablets by mouth every 6 (six) hours as needed for moderate pain.   omeprazole 20 MG capsule Commonly known as: PRILOSEC Take 20 mg by mouth daily.   pramipexole 0.125 MG tablet Commonly known as: MIRAPEX Take 0.125 mg by mouth 2 (two) times daily.   tiZANidine 4 MG tablet Commonly known as: Zanaflex Take 1 tablet (4 mg total) by mouth every 6 (six) hours as needed for muscle spasms. What changed: You were already taking a medication with the same name, and this prescription was added. Make sure you understand how and when to take each.   tiZANidine 2 MG tablet Commonly known as: ZANAFLEX Take 2 mg by mouth every 6 (six) hours as needed for muscle spasms. What changed: Another medication with the same name was added. Make sure you understand how and when to take each.   traMADol 50 MG tablet Commonly known as: ULTRAM Take 50 mg by mouth in the morning and at bedtime.   triamterene-hydrochlorothiazide 37.5-25 MG tablet Commonly known as: MAXZIDE-25 Take 0.5 tablets by mouth every morning.   trimethoprim 100 MG tablet Commonly known as: TRIMPEX Take 100 mg by mouth See admin instructions. Take twice daily for 3 days as needed   Turmeric Curcumin 500 MG Caps Take 500 mg by mouth daily.            Durable Medical Equipment  (From admission, onward)         Start     Ordered   11/20/20 1715  DME Walker rolling  Once       Question:  Patient needs a walker to treat with the following condition  Answer:  Primary osteoarthritis of right knee   11/20/20 1714   11/20/20 1715  DME 3 n 1  Once        11/20/20 1714   11/20/20 1715  DME Bedside commode  Once       Question:  Patient needs a bedside commode to treat with the following condition  Answer:  Primary osteoarthritis of right knee   11/20/20 1714          Diagnostic Studies: DG Chest 2 View  Result Date: 11/12/2020 CLINICAL  DATA:  80 year old female with a history upcoming knee surgery EXAM: CHEST - 2 VIEW COMPARISON:  None. FINDINGS: Cardiomediastinal silhouette within normal limits in size **Note De-Identified [place] Obfuscation** and contour. No evidence of central vascular congestion. No interlobular septal thickening. No pneumothorax or pleural effusion. Coarsened interstitial markings, with vague reticulonodular opacities bilaterally with no comparison available. No confluent airspace disease. No acute displaced fracture. Degenerative changes of the spine. Mild scoliotic curvature. IMPRESSION: Nonspecific reticulonodular opacities of the lungs with no available comparison, likely chronic changes in the absence of symptoms. No evidence of pulmonary edema or lobar pneumonia. Electronically Signed   By: Corrie Mckusick D.O.   On: 11/12/2020 09:45    Disposition: Discharge disposition: 01-Home or Self Care       Discharge Instructions    Call MD / Call 911   Complete by: As directed    If you experience chest pain or shortness of breath, CALL 911 and be transported to the hospital emergency room.  If you develope a fever above 101 F, pus (white drainage) or increased drainage or redness at the wound, or calf pain, call your surgeon's office.   Constipation Prevention   Complete by: As directed    Drink plenty of fluids.  Prune juice may be helpful.  You may use a stool softener, such as Colace (over the counter) 100 mg twice a day.  Use MiraLax (over the counter) for constipation as needed.   Diet - low sodium heart healthy   Complete by: As directed    Discharge instructions   Complete by: As directed    INSTRUCTIONS AFTER JOINT REPLACEMENT   Remove items at home which could result in a fall. This includes throw rugs or furniture in walking pathways ICE to the affected joint every three hours while awake for 30 minutes at a time, for at least the first 3-5 days, and then as needed for pain and swelling.  Continue to use ice for pain and swelling. You  may notice swelling that will progress down to the foot and ankle.  This is normal after surgery.  Elevate your leg when you are not up walking on it.   Continue to use the breathing machine you got in the hospital (incentive spirometer) which will help keep your temperature down.  It is common for your temperature to cycle up and down following surgery, especially at night when you are not up moving around and exerting yourself.  The breathing machine keeps your lungs expanded and your temperature down.   DIET:  As you were doing prior to hospitalization, we recommend a well-balanced diet.  DRESSING / WOUND CARE / SHOWERING  You may shower 3 days after surgery, but keep the wounds dry during showering.  You may use an occlusive plastic wrap (Press'n Seal for example), NO SOAKING/SUBMERGING IN THE BATHTUB.  If the bandage gets wet, change with a clean dry gauze.  If the incision gets wet, pat the wound dry with a clean towel.  ACTIVITY  Increase activity slowly as tolerated, but follow the weight bearing instructions below.   No driving for 6 weeks or until further direction given by your physician.  You cannot drive while taking narcotics.  No lifting or carrying greater than 10 lbs. until further directed by your surgeon. Avoid periods of inactivity such as sitting longer than an hour when not asleep. This helps prevent blood clots.  You may return to work once you are authorized by your doctor.     WEIGHT BEARING   Weight bearing as tolerated with assist device (walker, cane, etc) as directed, use it as long as suggested by your surgeon or therapist, **Note De-Identified Tercero Obfuscation** typically at least 4-6 weeks.   EXERCISES  Results after joint replacement surgery are often greatly improved when you follow the exercise, range of motion and muscle strengthening exercises prescribed by your doctor. Safety measures are also important to protect the joint from further injury. Any time any of these exercises cause you to  have increased pain or swelling, decrease what you are doing until you are comfortable again and then slowly increase them. If you have problems or questions, call your caregiver or physical therapist for advice.   Rehabilitation is important following a joint replacement. After just a few days of immobilization, the muscles of the leg can become weakened and shrink (atrophy).  These exercises are designed to build up the tone and strength of the thigh and leg muscles and to improve motion. Often times heat used for twenty to thirty minutes before working out will loosen up your tissues and help with improving the range of motion but do not use heat for the first two weeks following surgery (sometimes heat can increase post-operative swelling).   These exercises can be done on a training (exercise) mat, on the floor, on a table or on a bed. Use whatever works the best and is most comfortable for you.    Use music or television while you are exercising so that the exercises are a pleasant break in your day. This will make your life better with the exercises acting as a break in your routine that you can look forward to.   Perform all exercises about fifteen times, three times per day or as directed.  You should exercise both the operative leg and the other leg as well.  Exercises include:   Quad Sets - Tighten up the muscle on the front of the thigh (Quad) and hold for 5-10 seconds.   Straight Leg Raises - With your knee straight (if you were given a brace, keep it on), lift the leg to 60 degrees, hold for 3 seconds, and slowly lower the leg.  Perform this exercise against resistance later as your leg gets stronger.  Leg Slides: Lying on your back, slowly slide your foot toward your buttocks, bending your knee up off the floor (only go as far as is comfortable). Then slowly slide your foot back down until your leg is flat on the floor again.  Angel Wings: Lying on your back spread your legs to the side as  far apart as you can without causing discomfort.  Hamstring Strength:  Lying on your back, push your heel against the floor with your leg straight by tightening up the muscles of your buttocks.  Repeat, but this time bend your knee to a comfortable angle, and push your heel against the floor.  You may put a pillow under the heel to make it more comfortable if necessary.   A rehabilitation program following joint replacement surgery can speed recovery and prevent re-injury in the future due to weakened muscles. Contact your doctor or a physical therapist for more information on knee rehabilitation.    CONSTIPATION  Constipation is defined medically as fewer than three stools per week and severe constipation as less than one stool per week.  Even if you have a regular bowel pattern at home, your normal regimen is likely to be disrupted due to multiple reasons following surgery.  Combination of anesthesia, postoperative narcotics, change in appetite and fluid intake all can affect your bowels.   YOU MUST use at least one of the following **Note De-Identified Beltre Obfuscation** options; they are listed in order of increasing strength to get the job done.  They are all available over the counter, and you may need to use some, POSSIBLY even all of these options:    Drink plenty of fluids (prune juice may be helpful) and high fiber foods Colace 100 mg by mouth twice a day  Senokot for constipation as directed and as needed Dulcolax (bisacodyl), take with full glass of water  Miralax (polyethylene glycol) once or twice a day as needed.  If you have tried all these things and are unable to have a bowel movement in the first 3-4 days after surgery call either your surgeon or your primary doctor.    If you experience loose stools or diarrhea, hold the medications until you stool forms back up.  If your symptoms do not get better within 1 week or if they get worse, check with your doctor.  If you experience "the worst abdominal pain ever" or  develop nausea or vomiting, please contact the office immediately for further recommendations for treatment.   ITCHING:  If you experience itching with your medications, try taking only a single pain pill, or even half a pain pill at a time.  You can also use Benadryl over the counter for itching or also to help with sleep.   TED HOSE STOCKINGS:  Use stockings on both legs until for at least 2 weeks or as directed by physician office. They may be removed at night for sleeping.  MEDICATIONS:  See your medication summary on the "After Visit Summary" that nursing will review with you.  You may have some home medications which will be placed on hold until you complete the course of blood thinner medication.  It is important for you to complete the blood thinner medication as prescribed.  PRECAUTIONS:  If you experience chest pain or shortness of breath - call 911 immediately for transfer to the hospital emergency department.   If you develop a fever greater that 101 F, purulent drainage from wound, increased redness or drainage from wound, foul odor from the wound/dressing, or calf pain - CONTACT YOUR SURGEON.                                                   FOLLOW-UP APPOINTMENTS:  If you do not already have a post-op appointment, please call the office for an appointment to be seen by your surgeon.  Guidelines for how soon to be seen are listed in your "After Visit Summary", but are typically between 1-4 weeks after surgery.  OTHER INSTRUCTIONS:   Knee Replacement:  Do not place pillow under knee, focus on keeping the knee straight while resting. CPM instructions: 0-90 degrees, 2 hours in the morning, 2 hours in the afternoon, and 2 hours in the evening. Place foam block, curve side up under heel at all times except when in CPM or when walking.  DO NOT modify, tear, cut, or change the foam block in any way.  POST-OPERATIVE OPIOID TAPER INSTRUCTIONS: It is important to wean off of your opioid  medication as soon as possible. If you do not need pain medication after your surgery it is ok to stop day one. Opioids include: Codeine, Hydrocodone(Norco, Vicodin), Oxycodone(Percocet, oxycontin) and hydromorphone amongst others.  Long term and even short term use of opiods can cause: Increased **Note De-Identified [place] Obfuscation** pain response Dependence Constipation Depression Respiratory depression And more.  Withdrawal symptoms can include Flu like symptoms Nausea, vomiting And more Techniques to manage these symptoms Hydrate well Eat regular healthy meals Stay active Use relaxation techniques(deep breathing, meditating, yoga) Do Not substitute Alcohol to help with tapering If you have been on opioids for less than two weeks and do not have pain than it is ok to stop all together.  Plan to wean off of opioids This plan should start within one week post op of your joint replacement. Maintain the same interval or time between taking each dose and first decrease the dose.  Cut the total daily intake of opioids by one tablet each day Next start to increase the time between doses. The last dose that should be eliminated is the evening dose.     MAKE SURE YOU:  Understand these instructions.  Get help right away if you are not doing well or get worse.    Thank you for letting us be a part of your medical care team.  It is a privilege we respect greatly.  We hope these instructions will help you stay on track for a fast and full recovery!   Increase activity slowly as tolerated   Complete by: As directed    Post-operative opioid taper instructions:   Complete by: As directed    POST-OPERATIVE OPIOID TAPER INSTRUCTIONS: It is important to wean off of your opioid medication as soon as possible. If you do not need pain medication after your surgery it is ok to stop day one. Opioids include: Codeine, Hydrocodone(Norco, Vicodin), Oxycodone(Percocet, oxycontin) and hydromorphone amongst others.  Long term and even  short term use of opiods can cause: Increased pain response Dependence Constipation Depression Respiratory depression And more.  Withdrawal symptoms can include Flu like symptoms Nausea, vomiting And more Techniques to manage these symptoms Hydrate well Eat regular healthy meals Stay active Use relaxation techniques(deep breathing, meditating, yoga) Do Not substitute Alcohol to help with tapering If you have been on opioids for less than two weeks and do not have pain than it is ok to stop all together.  Plan to wean off of opioids This plan should start within one week post op of your joint replacement. Maintain the same interval or time between taking each dose and first decrease the dose.  Cut the total daily intake of opioids by one tablet each day Next start to increase the time between doses. The last dose that should be eliminated is the evening dose.          Follow-up Information    Melrose Nakayama, MD. Go on 11/30/2020.   Specialty: Orthopedic Surgery Why: Your appointment is scheduled for 9:30  Contact information: [place] [address] [phone]        [facility], [place] Follow up.   Specialty: [place] Why: HHPT will provide 5 home visits prior to you starting outpatient physical therapy  Contact information: [address] [place] [address] [phone]        [facility], [place]. Go on 11/30/2020.   Why: Your appointment is scheduled for 10:40. Please go over to the therapy side after your MD appointment to complete your paperwork  Contact information: [facility] [place] [phone]                Signed: Larwance Sachs  11/21/2020, 8:07 AM

## 2020-11-21 NOTE — TOC Transition Note (Signed)
**Note De-Identified [place] Obfuscation** Transition of Care [facility]) - CM/SW Discharge Note   Patient Details  Name: Karen Dickerson MRN: [id number] Date of Birth: May 13, 1941  Transition of Care [facility] CM/SW Contact:  Lennart Pall, LCSW Phone Number: 11/21/2020, 11:03 AM   Clinical Narrative:    Met briefly with pt and daughter.  Confirming she has received walker [employer].  [place] set up with [place].  No TOC needs.     Barriers to Discharge: No Barriers Identified   Patient Goals and CMS Choice        Discharge Placement                       Discharge Plan and Services                DME Arranged: Walker youth DME Agency: [employer] Date DME Agency Contacted:  (ortho bundle)     HH Arranged: PT HH Agency: [facility] Date [place] Agency Contacted:  (arranged prior to surgery)      Social Determinants of Health (SDOH) Interventions     Readmission Risk Interventions No flowsheet data found.

## 2020-11-21 NOTE — Evaluation (Signed)
**Note De-Identified [place] Obfuscation** Physical Therapy Evaluation Patient Details Name: Karen Dickerson MRN: [id number] DOB: Apr 28, 1941 Today's Date: 11/21/2020   History of Present Illness  80 y.o. female admitted 11/20/20 for R TKA. PMH includes: fibromyalgia, HTN, restless leg syndrome, osteoporosis.  Clinical Impression  Pt ambulated 81' + 35' with RW, completed stair training and demonstrates good understanding of HEP. She is ready to DC home from PT standpoint.     Follow Up Recommendations Follow surgeon's recommendation for DC plan and follow-up therapies;Home health PT    Equipment Recommendations  Rolling walker with 5" wheels    Recommendations for Other Services       Precautions / Restrictions Precautions Precautions: Knee;Fall Precaution Booklet Issued: Yes (comment) Precaution Comments: no falls in past year; reviewed no pillow under knee Restrictions Weight Bearing Restrictions: No RLE Weight Bearing: Weight bearing as tolerated      Mobility  Bed Mobility Overal bed mobility: Modified Independent             General bed mobility comments: used rail    Transfers Overall transfer level: Needs assistance Equipment used: Rolling walker (2 wheeled) Transfers: Sit to/from Stand Sit to Stand: Min guard         General transfer comment: VCs hand placement  Ambulation/Gait Ambulation/Gait assistance: Supervision Gait Distance (Feet): 125 Feet Assistive device: Rolling walker (2 wheeled) Gait Pattern/deviations: Step-to pattern;Decreased step length - right;Decreased step length - left Gait velocity: decr   General Gait Details: 97' + 35' with seated rest, VCs sequencing  Stairs Stairs: Yes Stairs assistance: Min guard Stair Management: Sideways;One rail Right;Step to pattern Number of Stairs: 2 General stair comments: VCs sequencing, daughter present  Wheelchair Mobility    Modified Rankin (Stroke Patients Only)       Balance                                              Pertinent Vitals/Pain Pain Assessment: 0-10 Pain Score: 3  Pain Location: R knee Pain Descriptors / Indicators: Sore Pain Intervention(s): Limited activity within patient's tolerance;Monitored during session;Premedicated before session;Ice applied    Home Living Family/patient expects to be discharged to:: Private residence Living Arrangements: Spouse/significant other Available Help at Discharge: Family Type of Home: House Home Access: Stairs to enter Entrance Stairs-Rails: Right Entrance Stairs-Number of Steps: 2 Home Layout: One level Home Equipment: Toilet riser;Shower seat Additional Comments: 2 daughters will assist at home    Prior Function Level of Independence: Independent               Hand Dominance        Extremity/Trunk Assessment   Upper Extremity Assessment Upper Extremity Assessment: Overall WFL for tasks assessed    Lower Extremity Assessment Lower Extremity Assessment: RLE deficits/detail RLE Deficits / Details: knee AAROM 0-75*, 3/5 SLR, at least 3/5 knee extension RLE Sensation: WNL RLE Coordination: WNL    Cervical / Trunk Assessment Cervical / Trunk Assessment: Normal  Communication   Communication: No difficulties  Cognition Arousal/Alertness: Awake/alert Behavior During Therapy: WFL for tasks assessed/performed Overall Cognitive Status: Within Functional Limits for tasks assessed                                        General Comments      Exercises Total Joint **Note De-Identified [place] Obfuscation** Exercises Ankle Circles/Pumps: AROM;Both;10 reps;Supine Quad Sets: AROM;Right;5 reps;Supine Short Arc Quad: AROM;Right;5 reps;Supine Heel Slides: AAROM;Right;5 reps;Supine Hip ABduction/ADduction: AROM;Right;5 reps;Supine Straight Leg Raises: AROM;Right;5 reps;Supine Long Arc Quad: AROM;Right;5 reps;Seated Knee Flexion: Right;AROM;10 reps;Seated Goniometric ROM: 0-75* AAROM R knee   Assessment/Plan    PT Assessment All further PT  needs can be met in the next venue of care  PT Problem List Decreased range of motion;Decreased strength;Decreased mobility;Decreased activity tolerance;Pain       PT Treatment Interventions      PT Goals (Current goals can be found in the Care Plan section)  Acute Rehab PT Goals Patient Stated Goal: to walk farther PT Goal Formulation: All assessment and education complete, DC therapy    Frequency     Barriers to discharge        Co-evaluation               AM-PAC PT "6 Clicks" Mobility  Outcome Measure Help needed turning from your back to your side while in a flat bed without using bedrails?: A Little Help needed moving from lying on your back to sitting on the side of a flat bed without using bedrails?: A Little Help needed moving to and from a bed to a chair (including a wheelchair)?: A Little Help needed standing up from a chair using your arms (e.g., wheelchair or bedside chair)?: A Little Help needed to walk in hospital room?: A Little Help needed climbing 3-5 steps with a railing? : A Little 6 Click Score: 18    End of Session Equipment Utilized During Treatment: Gait belt Activity Tolerance: Patient tolerated treatment well Patient left: in chair;with call bell/phone within reach;with family/visitor present Nurse Communication: Mobility status      Time: 1007-1219 PT Time Calculation (min) (ACUTE ONLY): 36 min   Charges:   PT Evaluation $PT Eval Moderate Complexity: 1 Mod PT Treatments $Gait Training: 8-22 mins      Blondell Reveal Kistler PT 11/21/2020  Acute Rehabilitation Services Pager [phone] Office [phone]

## 2020-11-21 NOTE — Progress Notes (Signed)
**Note De-Identified [place] Obfuscation** Subjective: 1 Day Post-Op Procedure(s) (LRB): RIGHT TOTAL KNEE ARTHROPLASTY (Right)   Patient is doing well and  Is looking forward to going home.  Activity level:  wbat Diet tolerance:  ok Voiding:  ok Patient reports pain as mild.    Objective: Vital signs in last 24 hours: Temp:  [97.5 F (36.4 C)-98.2 F (36.8 C)] 98.2 F (36.8 C) (06/01 0510) Pulse Rate:  [68-85] 68 (06/01 0510) Resp:  [9-20] 16 (06/01 0510) BP: (99-132)/(61-80) 119/70 (06/01 0510) SpO2:  [94 %-100 %] 97 % (06/01 0510)  Labs: No results for input(s): HGB in the last 72 hours. No results for input(s): WBC, RBC, HCT, PLT in the last 72 hours. No results for input(s): NA, K, CL, CO2, BUN, CREATININE, GLUCOSE, CALCIUM in the last 72 hours. No results for input(s): LABPT, INR in the last 72 hours.  Physical Exam:  Neurologically intact ABD soft Neurovascular intact Sensation intact distally Intact pulses distally Dorsiflexion/Plantar flexion intact Incision: dressing C/D/I and no drainage No cellulitis present Compartment soft  Assessment/Plan:  1 Day Post-Op Procedure(s) (LRB): RIGHT TOTAL KNEE ARTHROPLASTY (Right) Advance diet Up with therapy D/C IV fluids Discharge home with home health today if cleared and doing well with PT. Follow up in office 2 weeks post op. Continue on 81mg  ASA BID for dvt prevention.    Larwance Sachs  11/21/2020, 8:03 AM

## 2020-11-22 DIAGNOSIS — Z7982 Long term (current) use of aspirin: Secondary | ICD-10-CM | POA: Diagnosis not present

## 2020-11-22 DIAGNOSIS — K219 Gastro-esophageal reflux disease without esophagitis: Secondary | ICD-10-CM | POA: Diagnosis not present

## 2020-11-22 DIAGNOSIS — M1712 Unilateral primary osteoarthritis, left knee: Secondary | ICD-10-CM | POA: Diagnosis not present

## 2020-11-22 DIAGNOSIS — M810 Age-related osteoporosis without current pathological fracture: Secondary | ICD-10-CM | POA: Diagnosis not present

## 2020-11-22 DIAGNOSIS — I10 Essential (primary) hypertension: Secondary | ICD-10-CM | POA: Diagnosis not present

## 2020-11-22 DIAGNOSIS — M797 Fibromyalgia: Secondary | ICD-10-CM | POA: Diagnosis not present

## 2020-11-22 DIAGNOSIS — Z471 Aftercare following joint replacement surgery: Secondary | ICD-10-CM | POA: Diagnosis not present

## 2020-11-22 DIAGNOSIS — Z96651 Presence of right artificial knee joint: Secondary | ICD-10-CM | POA: Diagnosis not present

## 2020-11-22 DIAGNOSIS — G2581 Restless legs syndrome: Secondary | ICD-10-CM | POA: Diagnosis not present

## 2020-11-30 DIAGNOSIS — Z9889 Other specified postprocedural states: Secondary | ICD-10-CM | POA: Diagnosis not present

## 2020-11-30 DIAGNOSIS — M25661 Stiffness of right knee, not elsewhere classified: Secondary | ICD-10-CM | POA: Diagnosis not present

## 2020-11-30 DIAGNOSIS — Z96651 Presence of right artificial knee joint: Secondary | ICD-10-CM | POA: Diagnosis not present

## 2020-12-04 DIAGNOSIS — M25661 Stiffness of right knee, not elsewhere classified: Secondary | ICD-10-CM | POA: Diagnosis not present

## 2020-12-04 DIAGNOSIS — Z96651 Presence of right artificial knee joint: Secondary | ICD-10-CM | POA: Diagnosis not present

## 2020-12-06 DIAGNOSIS — M25661 Stiffness of right knee, not elsewhere classified: Secondary | ICD-10-CM | POA: Diagnosis not present

## 2020-12-06 DIAGNOSIS — Z96651 Presence of right artificial knee joint: Secondary | ICD-10-CM | POA: Diagnosis not present

## 2020-12-10 DIAGNOSIS — Z96651 Presence of right artificial knee joint: Secondary | ICD-10-CM | POA: Diagnosis not present

## 2020-12-10 DIAGNOSIS — M25661 Stiffness of right knee, not elsewhere classified: Secondary | ICD-10-CM | POA: Diagnosis not present

## 2020-12-13 DIAGNOSIS — M25661 Stiffness of right knee, not elsewhere classified: Secondary | ICD-10-CM | POA: Diagnosis not present

## 2020-12-13 DIAGNOSIS — Z96651 Presence of right artificial knee joint: Secondary | ICD-10-CM | POA: Diagnosis not present

## 2020-12-14 DIAGNOSIS — Z96651 Presence of right artificial knee joint: Secondary | ICD-10-CM | POA: Diagnosis not present

## 2020-12-18 DIAGNOSIS — Z96651 Presence of right artificial knee joint: Secondary | ICD-10-CM | POA: Diagnosis not present

## 2020-12-18 DIAGNOSIS — M25661 Stiffness of right knee, not elsewhere classified: Secondary | ICD-10-CM | POA: Diagnosis not present

## 2020-12-20 DIAGNOSIS — Z96651 Presence of right artificial knee joint: Secondary | ICD-10-CM | POA: Diagnosis not present

## 2020-12-20 DIAGNOSIS — M25661 Stiffness of right knee, not elsewhere classified: Secondary | ICD-10-CM | POA: Diagnosis not present

## 2020-12-25 DIAGNOSIS — M25661 Stiffness of right knee, not elsewhere classified: Secondary | ICD-10-CM | POA: Diagnosis not present

## 2020-12-25 DIAGNOSIS — Z96651 Presence of right artificial knee joint: Secondary | ICD-10-CM | POA: Diagnosis not present

## 2020-12-27 DIAGNOSIS — M25661 Stiffness of right knee, not elsewhere classified: Secondary | ICD-10-CM | POA: Diagnosis not present

## 2020-12-27 DIAGNOSIS — Z96651 Presence of right artificial knee joint: Secondary | ICD-10-CM | POA: Diagnosis not present

## 2021-01-01 DIAGNOSIS — M25661 Stiffness of right knee, not elsewhere classified: Secondary | ICD-10-CM | POA: Diagnosis not present

## 2021-01-01 DIAGNOSIS — Z96651 Presence of right artificial knee joint: Secondary | ICD-10-CM | POA: Diagnosis not present

## 2021-01-03 DIAGNOSIS — M25661 Stiffness of right knee, not elsewhere classified: Secondary | ICD-10-CM | POA: Diagnosis not present

## 2021-01-03 DIAGNOSIS — Z96651 Presence of right artificial knee joint: Secondary | ICD-10-CM | POA: Diagnosis not present

## 2021-01-07 DIAGNOSIS — M25661 Stiffness of right knee, not elsewhere classified: Secondary | ICD-10-CM | POA: Diagnosis not present

## 2021-01-07 DIAGNOSIS — Z96651 Presence of right artificial knee joint: Secondary | ICD-10-CM | POA: Diagnosis not present

## 2021-01-11 DIAGNOSIS — M25661 Stiffness of right knee, not elsewhere classified: Secondary | ICD-10-CM | POA: Diagnosis not present

## 2021-01-11 DIAGNOSIS — Z96651 Presence of right artificial knee joint: Secondary | ICD-10-CM | POA: Diagnosis not present

## 2021-01-15 DIAGNOSIS — Z96651 Presence of right artificial knee joint: Secondary | ICD-10-CM | POA: Diagnosis not present

## 2021-01-15 DIAGNOSIS — M25661 Stiffness of right knee, not elsewhere classified: Secondary | ICD-10-CM | POA: Diagnosis not present

## 2021-01-17 DIAGNOSIS — Z96651 Presence of right artificial knee joint: Secondary | ICD-10-CM | POA: Diagnosis not present

## 2021-01-17 DIAGNOSIS — M25661 Stiffness of right knee, not elsewhere classified: Secondary | ICD-10-CM | POA: Diagnosis not present

## 2021-01-21 DIAGNOSIS — Z9889 Other specified postprocedural states: Secondary | ICD-10-CM | POA: Diagnosis not present

## 2021-02-05 DIAGNOSIS — Z1231 Encounter for screening mammogram for malignant neoplasm of breast: Secondary | ICD-10-CM | POA: Diagnosis not present

## 2021-02-20 DIAGNOSIS — M25561 Pain in right knee: Secondary | ICD-10-CM | POA: Diagnosis not present

## 2021-02-20 DIAGNOSIS — M545 Low back pain, unspecified: Secondary | ICD-10-CM | POA: Diagnosis not present

## 2021-02-28 DIAGNOSIS — L570 Actinic keratosis: Secondary | ICD-10-CM | POA: Diagnosis not present

## 2021-02-28 DIAGNOSIS — D225 Melanocytic nevi of trunk: Secondary | ICD-10-CM | POA: Diagnosis not present

## 2021-02-28 DIAGNOSIS — X32XXXD Exposure to sunlight, subsequent encounter: Secondary | ICD-10-CM | POA: Diagnosis not present

## 2021-02-28 DIAGNOSIS — L820 Inflamed seborrheic keratosis: Secondary | ICD-10-CM | POA: Diagnosis not present

## 2021-02-28 DIAGNOSIS — L821 Other seborrheic keratosis: Secondary | ICD-10-CM | POA: Diagnosis not present

## 2021-03-07 DIAGNOSIS — M810 Age-related osteoporosis without current pathological fracture: Secondary | ICD-10-CM | POA: Diagnosis not present

## 2021-03-07 DIAGNOSIS — M7052 Other bursitis of knee, left knee: Secondary | ICD-10-CM | POA: Diagnosis not present

## 2021-03-07 DIAGNOSIS — Z6825 Body mass index (BMI) 25.0-25.9, adult: Secondary | ICD-10-CM | POA: Diagnosis not present

## 2021-03-07 DIAGNOSIS — D171 Benign lipomatous neoplasm of skin and subcutaneous tissue of trunk: Secondary | ICD-10-CM | POA: Diagnosis not present

## 2021-03-07 DIAGNOSIS — M7051 Other bursitis of knee, right knee: Secondary | ICD-10-CM | POA: Diagnosis not present

## 2021-03-15 DIAGNOSIS — M810 Age-related osteoporosis without current pathological fracture: Secondary | ICD-10-CM | POA: Diagnosis not present

## 2021-03-15 DIAGNOSIS — Z96651 Presence of right artificial knee joint: Secondary | ICD-10-CM | POA: Diagnosis not present

## 2021-03-15 DIAGNOSIS — M25561 Pain in right knee: Secondary | ICD-10-CM | POA: Diagnosis not present

## 2021-03-15 DIAGNOSIS — M1990 Unspecified osteoarthritis, unspecified site: Secondary | ICD-10-CM | POA: Diagnosis not present

## 2021-03-15 DIAGNOSIS — I10 Essential (primary) hypertension: Secondary | ICD-10-CM | POA: Diagnosis not present

## 2021-03-15 DIAGNOSIS — H269 Unspecified cataract: Secondary | ICD-10-CM | POA: Diagnosis not present

## 2021-03-22 DIAGNOSIS — D171 Benign lipomatous neoplasm of skin and subcutaneous tissue of trunk: Secondary | ICD-10-CM | POA: Diagnosis not present

## 2021-03-28 ENCOUNTER — Other Ambulatory Visit: Payer: Self-pay | Admitting: Surgery

## 2021-03-28 DIAGNOSIS — D171 Benign lipomatous neoplasm of skin and subcutaneous tissue of trunk: Secondary | ICD-10-CM

## 2021-04-02 ENCOUNTER — Ambulatory Visit
Admission: RE | Admit: 2021-04-02 | Discharge: 2021-04-02 | Disposition: A | Discharge status: Home / Self Care | Payer: PPO | Source: Ambulatory Visit | Attending: Surgery | Admitting: Surgery

## 2021-04-02 DIAGNOSIS — K439 Ventral hernia without obstruction or gangrene: Secondary | ICD-10-CM | POA: Diagnosis not present

## 2021-04-02 DIAGNOSIS — D171 Benign lipomatous neoplasm of skin and subcutaneous tissue of trunk: Secondary | ICD-10-CM

## 2021-04-02 DIAGNOSIS — D179 Benign lipomatous neoplasm, unspecified: Secondary | ICD-10-CM | POA: Diagnosis not present

## 2021-04-02 DIAGNOSIS — I878 Other specified disorders of veins: Secondary | ICD-10-CM | POA: Diagnosis not present

## 2021-04-02 DIAGNOSIS — N3289 Other specified disorders of bladder: Secondary | ICD-10-CM | POA: Diagnosis not present

## 2021-04-15 DIAGNOSIS — M25561 Pain in right knee: Secondary | ICD-10-CM | POA: Diagnosis not present

## 2021-04-17 DIAGNOSIS — D171 Benign lipomatous neoplasm of skin and subcutaneous tissue of trunk: Secondary | ICD-10-CM | POA: Diagnosis not present

## 2021-04-17 DIAGNOSIS — K439 Ventral hernia without obstruction or gangrene: Secondary | ICD-10-CM | POA: Diagnosis not present

## 2021-05-01 DIAGNOSIS — R300 Dysuria: Secondary | ICD-10-CM | POA: Diagnosis not present

## 2021-05-07 DIAGNOSIS — G894 Chronic pain syndrome: Secondary | ICD-10-CM | POA: Diagnosis not present

## 2021-05-07 DIAGNOSIS — M797 Fibromyalgia: Secondary | ICD-10-CM | POA: Diagnosis not present

## 2021-05-07 DIAGNOSIS — R300 Dysuria: Secondary | ICD-10-CM | POA: Diagnosis not present

## 2021-05-07 DIAGNOSIS — G2581 Restless legs syndrome: Secondary | ICD-10-CM | POA: Diagnosis not present

## 2021-05-07 DIAGNOSIS — I10 Essential (primary) hypertension: Secondary | ICD-10-CM | POA: Diagnosis not present

## 2021-05-07 DIAGNOSIS — M810 Age-related osteoporosis without current pathological fracture: Secondary | ICD-10-CM | POA: Diagnosis not present

## 2021-05-08 DIAGNOSIS — Z1389 Encounter for screening for other disorder: Secondary | ICD-10-CM | POA: Diagnosis not present

## 2021-05-08 DIAGNOSIS — Z0000 Encounter for general adult medical examination without abnormal findings: Secondary | ICD-10-CM | POA: Diagnosis not present

## 2021-05-30 DIAGNOSIS — R229 Localized swelling, mass and lump, unspecified: Secondary | ICD-10-CM | POA: Diagnosis not present

## 2021-06-07 DIAGNOSIS — H269 Unspecified cataract: Secondary | ICD-10-CM | POA: Diagnosis not present

## 2021-06-07 DIAGNOSIS — M1990 Unspecified osteoarthritis, unspecified site: Secondary | ICD-10-CM | POA: Diagnosis not present

## 2021-06-07 DIAGNOSIS — M810 Age-related osteoporosis without current pathological fracture: Secondary | ICD-10-CM | POA: Diagnosis not present

## 2021-06-07 DIAGNOSIS — I10 Essential (primary) hypertension: Secondary | ICD-10-CM | POA: Diagnosis not present

## 2021-06-14 DIAGNOSIS — D171 Benign lipomatous neoplasm of skin and subcutaneous tissue of trunk: Secondary | ICD-10-CM | POA: Diagnosis not present

## 2021-06-20 DIAGNOSIS — R3915 Urgency of urination: Secondary | ICD-10-CM | POA: Diagnosis not present

## 2021-06-20 DIAGNOSIS — R350 Frequency of micturition: Secondary | ICD-10-CM | POA: Diagnosis not present

## 2021-06-20 DIAGNOSIS — R300 Dysuria: Secondary | ICD-10-CM | POA: Diagnosis not present

## 2021-08-14 DIAGNOSIS — M25552 Pain in left hip: Secondary | ICD-10-CM | POA: Diagnosis not present

## 2021-08-14 DIAGNOSIS — M7061 Trochanteric bursitis, right hip: Secondary | ICD-10-CM | POA: Diagnosis not present

## 2021-08-23 ENCOUNTER — Emergency Department (HOSPITAL_BASED_OUTPATIENT_CLINIC_OR_DEPARTMENT_OTHER): Payer: PPO

## 2021-08-23 ENCOUNTER — Emergency Department (HOSPITAL_BASED_OUTPATIENT_CLINIC_OR_DEPARTMENT_OTHER)
Admission: EM | Admit: 2021-08-23 | Discharge: 2021-08-23 | Disposition: A | Discharge status: Home / Self Care | Payer: PPO | Attending: Emergency Medicine | Admitting: Emergency Medicine

## 2021-08-23 ENCOUNTER — Encounter (HOSPITAL_BASED_OUTPATIENT_CLINIC_OR_DEPARTMENT_OTHER): Payer: Self-pay | Admitting: *Deleted

## 2021-08-23 ENCOUNTER — Other Ambulatory Visit: Payer: Self-pay

## 2021-08-23 DIAGNOSIS — J181 Lobar pneumonia, unspecified organism: Secondary | ICD-10-CM | POA: Insufficient documentation

## 2021-08-23 DIAGNOSIS — Z7982 Long term (current) use of aspirin: Secondary | ICD-10-CM | POA: Diagnosis not present

## 2021-08-23 DIAGNOSIS — I10 Essential (primary) hypertension: Secondary | ICD-10-CM | POA: Diagnosis not present

## 2021-08-23 DIAGNOSIS — J189 Pneumonia, unspecified organism: Secondary | ICD-10-CM

## 2021-08-23 DIAGNOSIS — Z79899 Other long term (current) drug therapy: Secondary | ICD-10-CM | POA: Insufficient documentation

## 2021-08-23 DIAGNOSIS — Z20822 Contact with and (suspected) exposure to covid-19: Secondary | ICD-10-CM | POA: Diagnosis not present

## 2021-08-23 DIAGNOSIS — R0789 Other chest pain: Secondary | ICD-10-CM | POA: Diagnosis not present

## 2021-08-23 DIAGNOSIS — R000 Tachycardia, unspecified: Secondary | ICD-10-CM | POA: Diagnosis not present

## 2021-08-23 DIAGNOSIS — M797 Fibromyalgia: Secondary | ICD-10-CM | POA: Diagnosis not present

## 2021-08-23 DIAGNOSIS — G894 Chronic pain syndrome: Secondary | ICD-10-CM | POA: Diagnosis not present

## 2021-08-23 DIAGNOSIS — M549 Dorsalgia, unspecified: Secondary | ICD-10-CM | POA: Insufficient documentation

## 2021-08-23 DIAGNOSIS — R072 Precordial pain: Secondary | ICD-10-CM | POA: Diagnosis present

## 2021-08-23 DIAGNOSIS — R079 Chest pain, unspecified: Secondary | ICD-10-CM

## 2021-08-23 LAB — HEPATIC FUNCTION PANEL
ALT: 19 U/L (ref 0–44)
AST: 21 U/L (ref 15–41)
Albumin: 3.4 g/dL — ABNORMAL LOW (ref 3.5–5.0)
Alkaline Phosphatase: 50 U/L (ref 38–126)
Bilirubin, Direct: 0.2 mg/dL (ref 0.0–0.2)
Indirect Bilirubin: 0.5 mg/dL (ref 0.3–0.9)
Total Bilirubin: 0.7 mg/dL (ref 0.3–1.2)
Total Protein: 7 g/dL (ref 6.5–8.1)

## 2021-08-23 LAB — CBC
HCT: 39.9 % (ref 36.0–46.0)
Hemoglobin: 13.5 g/dL (ref 12.0–15.0)
MCH: 29.3 pg (ref 26.0–34.0)
MCHC: 33.8 g/dL (ref 30.0–36.0)
MCV: 86.6 fL (ref 80.0–100.0)
Platelets: 290 10*3/uL (ref 150–400)
RBC: 4.61 MIL/uL (ref 3.87–5.11)
RDW: 13.1 % (ref 11.5–15.5)
WBC: 10.1 10*3/uL (ref 4.0–10.5)
nRBC: 0 % (ref 0.0–0.2)

## 2021-08-23 LAB — BASIC METABOLIC PANEL
Anion gap: 9 (ref 5–15)
BUN: 20 mg/dL (ref 8–23)
CO2: 26 mmol/L (ref 22–32)
Calcium: 9.6 mg/dL (ref 8.9–10.3)
Chloride: 101 mmol/L (ref 98–111)
Creatinine, Ser: 1.16 mg/dL — ABNORMAL HIGH (ref 0.44–1.00)
GFR, Estimated: 48 mL/min — ABNORMAL LOW (ref >60)
Glucose, Bld: 184 mg/dL — ABNORMAL HIGH (ref 70–99)
Potassium: 3.5 mmol/L (ref 3.5–5.1)
Sodium: 136 mmol/L (ref 135–145)

## 2021-08-23 LAB — MAGNESIUM: Magnesium: 1.8 mg/dL (ref 1.7–2.4)

## 2021-08-23 LAB — TROPONIN I (HIGH SENSITIVITY)
Troponin I (High Sensitivity): 10 ng/L (ref <18)
Troponin I (High Sensitivity): 10 ng/L (ref <18)

## 2021-08-23 LAB — RESP PANEL BY RT-PCR (FLU A&B, COVID) ARPGX2
Influenza A by PCR: NEGATIVE
Influenza B by PCR: NEGATIVE
SARS Coronavirus 2 by RT PCR: NEGATIVE

## 2021-08-23 LAB — LIPASE, BLOOD: Lipase: 36 U/L (ref 11–51)

## 2021-08-23 MED ORDER — IOHEXOL 350 MG/ML SOLN
80.0000 mL | Freq: Once | INTRAVENOUS | Status: AC | PRN
  Administered 2021-08-23: 80 mL via INTRAVENOUS

## 2021-08-23 MED ORDER — DOXYCYCLINE HYCLATE 100 MG PO TABS
100.0000 mg | ORAL_TABLET | Freq: Once | ORAL | Status: AC
  Administered 2021-08-23: 100 mg via ORAL
  Filled 2021-08-23: qty 1

## 2021-08-23 MED ORDER — ASPIRIN 81 MG PO CHEW
324.0000 mg | CHEWABLE_TABLET | Freq: Once | ORAL | Status: AC
  Administered 2021-08-23: 324 mg via ORAL
  Filled 2021-08-23: qty 4

## 2021-08-23 MED ORDER — DOXYCYCLINE HYCLATE 100 MG PO CAPS: 100.0000 mg | ORAL_CAPSULE | Freq: Two times a day (BID) | ORAL | 0 refills | Status: AC

## 2021-08-23 NOTE — Discharge Instructions (Signed)
**Note De-Identified [place] Obfuscation** Your work-up today revealed no evidence of blood clot or convincing evidence of acute cardiac disease but did reveal evidence of acute pneumonia.  We gave you dose of antibiotics and your vital signs were reassuring including no low oxygen readings so we had a shared decision-making conversation and agree with your desire for discharge home with antibiotics.  Please follow-up with your primary doctor in the next several days and rest and stay hydrated.  If you start having any worsening shortness of breath or worsen chest pain and are concerned that things are worsening, please return to the nearest emergency department as you would likely need admission for further management of the pneumonia.   ?

## 2021-08-23 NOTE — ED Notes (Signed)
**Note De-identified [place] Obfuscation** Patient verbalizes understanding of discharge instructions. Opportunity for questioning and answers were provided. Armband removed by staff, pt discharged from ED. Ambulated out to lobby  

## 2021-08-23 NOTE — ED Provider Notes (Signed)
**Note De-Identified [place] Obfuscation** [facility] EMERGENCY DEPARTMENT Provider Note   CSN: [id number] Arrival date & time: 08/23/21  1701     History  Chief Complaint  Patient presents with   Chest Pain    Karen Dickerson is a 81 y.o. female.  The history is provided by the patient and medical records. No language interpreter was used.  Chest Pain Pain location:  Substernal area and L chest Pain quality: aching and dull   Pain radiates to:  Upper back Pain severity:  Severe Onset quality:  Sudden Duration:  2 days Timing:  Constant Progression:  Waxing and waning Chronicity:  Recurrent Relieved by:  Nothing Worsened by:  Deep breathing Ineffective treatments:  None tried Associated symptoms: back pain   Associated symptoms: no abdominal pain, no anxiety, no cough, no diaphoresis, no dysphagia, no fatigue, no fever, no headache, no heartburn, no nausea, no near-syncope, no numbness, no palpitations, no shortness of breath, no vomiting and no weakness       Home Medications Prior to Admission medications   Medication Sig Start Date End Date Taking? Authorizing Provider  acetaminophen (TYLENOL) 500 MG tablet Take 500 mg by mouth every 8 (eight) hours as needed for moderate pain.    [provider]  amLODipine (NORVASC) 10 MG tablet Take 10 mg by mouth daily.    [provider]  aspirin 81 MG chewable tablet Chew 1 tablet (81 mg total) by mouth 2 (two) times daily. 11/21/20   Loni Dolly, PA-C  HYDROcodone-acetaminophen (NORCO/VICODIN) 5-325 MG tablet Take 1-2 tablets by mouth every 6 (six) hours as needed for moderate pain. 11/21/20 11/21/21  Loni Dolly, PA-C  omeprazole (PRILOSEC) 20 MG capsule Take 20 mg by mouth daily.    [provider]  pramipexole (MIRAPEX) 0.125 MG tablet Take 0.125 mg by mouth 2 (two) times daily. 11/03/12   [provider]  tiZANidine (ZANAFLEX) 2 MG tablet Take 2 mg by mouth every 6 (six) hours as needed for muscle spasms.    [provider]  tiZANidine (ZANAFLEX) 4 MG tablet Take 1 tablet (4 mg total) by mouth every 6 (six) hours as needed for muscle spasms. 11/21/20 11/21/21  Loni Dolly, PA-C  traMADol (ULTRAM) 50 MG tablet Take 50 mg by mouth in the morning and at bedtime.    [provider]  triamterene-hydrochlorothiazide (MAXZIDE-25) 37.5-25 MG tablet Take 0.5 tablets by mouth every morning. 09/25/20   [provider]  trimethoprim (TRIMPEX) 100 MG tablet Take 100 mg by mouth See admin instructions. Take twice daily for 3 days as needed    [provider]  Turmeric Curcumin 500 MG CAPS Take 500 mg by mouth daily.    [provider]      Allergies    Fish oil and Penicillins    Review of Systems   Review of Systems  Constitutional:  Negative for chills, diaphoresis, fatigue and fever.  HENT:  Negative for congestion and trouble swallowing.   Respiratory:  Negative for cough, chest tightness, shortness of breath and wheezing.   Cardiovascular:  Positive for chest pain. Negative for palpitations, leg swelling and near-syncope.  Gastrointestinal:  Negative for abdominal pain, constipation, diarrhea, heartburn, nausea and vomiting.  Genitourinary:  Negative for dysuria and flank pain.  Musculoskeletal:  Positive for back pain. Negative for neck pain and neck stiffness.  Skin:  Negative for rash and wound.  Neurological:  Negative for weakness, numbness and headaches.  Psychiatric/Behavioral:  Negative for agitation and confusion. **Note De-Identified [place] Obfuscation** All other systems reviewed and are negative.  Physical Exam Updated Vital Signs BP 137/85 (BP Location: Right Arm)    Pulse 87    Temp 98 F (36.7 C) (Oral)    Resp 18    Ht 5\' 1"  (1.549 m)    Wt 63.1 kg    SpO2 98%    BMI 26.28 kg/m  Physical Exam Vitals and nursing note reviewed.  Constitutional:      General: She is not in acute distress.    Appearance: She is well-developed. She is not ill-appearing, toxic-appearing or diaphoretic.  HENT:      Head: Normocephalic and atraumatic.  Eyes:     Conjunctiva/sclera: Conjunctivae normal.     Pupils: Pupils are equal, round, and reactive to light.  Cardiovascular:     Rate and Rhythm: Normal rate and regular rhythm.     Heart sounds: Normal heart sounds. No murmur heard. Pulmonary:     Effort: Pulmonary effort is normal. No respiratory distress.     Breath sounds: Normal breath sounds. No decreased breath sounds, wheezing, rhonchi or rales.  Chest:     Chest wall: No tenderness.  Abdominal:     Palpations: Abdomen is soft.     Tenderness: There is no abdominal tenderness.  Musculoskeletal:        General: No swelling.     Cervical back: Neck supple.     Right lower leg: No tenderness. No edema.     Left lower leg: No tenderness. No edema.  Skin:    General: Skin is warm and dry.     Capillary Refill: Capillary refill takes less than 2 seconds.  Neurological:     General: No focal deficit present.     Mental Status: She is alert.  Psychiatric:        Mood and Affect: Mood normal.    ED Results / Procedures / Treatments   Labs (all labs ordered are listed, but only abnormal results are displayed) Labs Reviewed  BASIC METABOLIC PANEL - Abnormal; Notable for the following components:      Result Value   Glucose, Bld 184 (*)    Creatinine, Ser 1.16 (*)    GFR, Estimated 48 (*)    All other components within normal limits  HEPATIC FUNCTION PANEL - Abnormal; Notable for the following components:   Albumin 3.4 (*)    All other components within normal limits  RESP PANEL BY RT-PCR (FLU A&B, COVID) ARPGX2  CBC  LIPASE, BLOOD  MAGNESIUM  TROPONIN I (HIGH SENSITIVITY)  TROPONIN I (HIGH SENSITIVITY)  TROPONIN I (HIGH SENSITIVITY)    EKG EKG Interpretation  Date/Time:  Friday August 23 2021 17:17:42 EST Ventricular Rate:  83 PR Interval:  170 QRS Duration: 96 QT Interval:  356 QTC Calculation: 419 R Axis:   69 Text Interpretation: Sinus tachycardia Ventricular  trigeminy Minimal ST depression, anterolateral leads ST elevation, consider inferior injury When compared to prior, more PVCs. No STEMI Confirmed by Antony Blackbird [phone]) on 08/23/2021 5:19:30 PM  Radiology DG Chest 2 View  Result Date: 08/23/2021 CLINICAL DATA:  Chest pain. EXAM: CHEST - 2 VIEW COMPARISON:  Chest x-ray 11/12/2020. FINDINGS: Moderate size hiatal hernia is present with air-fluid level. Lungs are clear. No pleural effusion or pneumothorax. Cardiomediastinal silhouette within normal limits. No acute fractures. S shaped curvature of the thoracolumbar spine. IMPRESSION: 1. No acute cardiopulmonary process. 2. Moderate-sized hiatal hernia. Electronically Signed   By: Ronney Asters M.D.   On: 08/23/2021 17:47 **Note De-Identified [place] Obfuscation** CT Angio Chest PE W and/or Wo Contrast  Result Date: 08/23/2021 CLINICAL DATA:  Chest pain since last night. She was seen at [facility] care today and had a normal EKG. She was called by the office and told to come to the ER for an abnormal lab but she does not know the test EXAM: CT ANGIOGRAPHY CHEST WITH CONTRAST TECHNIQUE: Multidetector CT imaging of the chest was performed using the standard protocol during bolus administration of intravenous contrast. Multiplanar CT image reconstructions and MIPs were obtained to evaluate the vascular anatomy. RADIATION DOSE REDUCTION: This exam was performed according to the departmental dose-optimization program which includes automated exposure control, adjustment of the mA and/or kV according to patient size and/or use of iterative reconstruction technique. CONTRAST:  2mL OMNIPAQUE IOHEXOL 350 MG/ML SOLN COMPARISON:  Current and prior chest radiographs. FINDINGS: Cardiovascular: Pulmonary arteries are well opacified. There is no evidence of a pulmonary embolism. Heart normal in size and configuration. No pericardial effusion. Normal great vessels. Mediastinum/Nodes: Moderate size hiatal hernia. No neck base, mediastinal or hilar masses. No enlarged  lymph nodes. Trachea and esophagus are unremarkable. Lungs/Pleura: Small areas of ground-glass opacity are noted in the left upper lobe. Remainder of the lungs is clear. No pleural effusion or pneumothorax. Upper Abdomen: No acute findings.  Status post cholecystectomy. Musculoskeletal: No acute fracture or acute finding. No bone lesion. No chest wall masses. Review of the MIP images confirms the above findings. IMPRESSION: 1. No evidence of a pulmonary embolism. 2. Small areas of ground-glass opacity in the left upper lobe, consistent with infection. Atypical etiologies including viral should be considered in the differential diagnosis. Given that these areas of opacity have a somewhat nodular appearance, follow-up is recommended Non-contrast chest CT at 3-6 months is recommended. If nodules persist, subsequent management will be based upon the most suspicious nodule(s). This recommendation follows the consensus statement: Guidelines for Management of Incidental Pulmonary Nodules Detected on CT Images: From the Fleischner Society 2017; Radiology 2017; 284:228-243. 3. Moderate-sized hiatal hernia. Electronically Signed   By: Lajean Manes M.D.   On: 08/23/2021 18:54    Procedures Procedures    Medications Ordered in ED Medications  doxycycline (VIBRA-TABS) tablet 100 mg (has no administration in time range)  aspirin chewable tablet 324 mg (324 mg Oral Given 08/23/21 1849)  iohexol (OMNIPAQUE) 350 MG/ML injection 80 mL (80 mLs Intravenous Contrast Given 08/23/21 1833)    ED Course/ Medical Decision Making/ A&P                           Medical Decision Making Amount and/or Complexity of Data Reviewed Labs: ordered. Radiology: ordered.  Risk OTC drugs. Prescription drug management.    Karen Dickerson is a 81 y.o. female with a past medical history significant for GERD, fibromyalgia, report of previous "heart wall inflammation", arthritis, and hypertension who presents with chest pain and  abnormal lab at PCP.  Patient reports that yesterday evening about 8:30 PM she had onset of pain in her back and her chest that is worse with deep breathing.  She says that she took half of a meloxicam and went to bed and then woke up in the middle night with it again so did another tablet.  She reports she still been having pain this morning in the chest and back that is much worse with deep breathing.  She denies history of DVT or PE but does say that she has had more immobility **Note De-Identified [place] Obfuscation** the last few weeks after hip injections.  She denies fevers, chills, ingestion, cough.  Denies significant shortness of breath, nausea, vomiting, palpitations, diaphoresis, and denies any trauma.  She said that the last time she had pain like this was when she had "heart wall inflammation" but cannot describe further details.  She denies any constipation, diarrhea, or urinary changes.  Denies any new leg swelling or leg pains aside from her chronic hip pains.  She reportedly went to her PCP today and had some blood work done and then they called her to say that something was elevated and she needed to come to the hospital.  Currently, she reports the pain is 5 out of 10.  She reports that she took a muscle relaxant before coming that helped her discomfort.   On exam, lungs are clear.  Chest was nontender.  Did not appreciate a loud murmur.  Abdomen nontender.  Back nontender.  Legs nontender and nonedematous.  Good pulses in extremities.  Patient smiling and resting comfortably.  Oxygen saturations normal and she is not tachycardic or tachypneic.  She is afebrile.  Blood pressure in the 130s on arrival.  Given the patient's report of chest pain and elevated lab of some kind, we will get basic work-up including troponins.  We will also get labs to look for elevations with liver function, lipase, and will get a chest x-ray.  With more conversation with the patient, her recent immobility from injections, and the severe pleuritic  pain, will get a CT PE study to rule out pulmonary embolism.  Although the pain is in her chest and her back, she reports it is not sharp and she does not have elevated blood pressures critically.  Low suspicion for dissection at this time.  We will give patient aspirin while starting her work-up and will reassess.  Anticipate reassessment after work-up to determine disposition.  Work-up began to return.  Patient's troponin appears stable at 26.  CT scan shows no evidence of pulmonary embolism but does show evidence of pneumonia.  Patient was informed of this and she does not want to be admitted.  We had a shared decision-making conversation and she ambulated without any hypoxia or symptoms.  She would like to go home.  We will give a dose of antibiotics now and we will have her get a prescription printed.  She will see her PCP in the neck several days for reassessment.  Suspect her PVCs are also related to the pneumonia.  Her troponin remained stable at 26 and was not rising.  She was not having further discomfort.  We feel she is safe for discharge home and she agrees.  Patient discharged in good condition with understanding return precautions and follow-up instructions.          Final Clinical Impression(s) / ED Diagnoses Final diagnoses:  Chest pain, unspecified type  Community acquired pneumonia of left upper lobe of lung    Rx / DC Orders ED Discharge Orders          Ordered    doxycycline (VIBRAMYCIN) 100 MG capsule  2 times daily        08/23/21 2134            Clinical Impression: 1. Chest pain, unspecified type   2. Community acquired pneumonia of left upper lobe of lung     Disposition: Discharge  Condition: Good  I have discussed the results, Dx and Tx plan with the pt(& family if present). He/she/they expressed **Note De-Identified [place] Obfuscation** understanding and agree(s) with the plan. Discharge instructions discussed at great length. Strict return precautions discussed and pt &/or family have  verbalized understanding of the instructions. No further questions at time of discharge.    New Prescriptions   DOXYCYCLINE (VIBRAMYCIN) 100 MG CAPSULE    Take 1 capsule (100 mg total) by mouth 2 (two) times daily for 7 days.    Follow Up: Kathyrn Lass, MD [place] [address] [phone]     [facility] [place] [facility]GENCY DEPARTMENT [address] [id number] [place] GGEZ [place]       , Gwenyth Allegra, MD 08/23/21 2136

## 2021-08-23 NOTE — ED Triage Notes (Signed)
**Note De-Identified [place] Obfuscation** Chest pain since last night. She was seen at [facility] care today and had a normal EKG. She was called by the office and told to come to the ER for an abnormal lab but she does not know the test.  ?

## 2021-08-23 NOTE — ED Notes (Signed)
**Note De-Identified [place] Obfuscation** Ambulated patient per physicians' request. Resting pulse Ox was 94%. Patient maintained a steady gait w/o difficulties speaking in complete sentences. Patient returned to room. Physician notified of results. Patient tolerated well. ?

## 2021-08-28 ENCOUNTER — Other Ambulatory Visit: Payer: Self-pay

## 2021-08-28 ENCOUNTER — Ambulatory Visit: Payer: PPO | Admitting: Podiatry

## 2021-08-28 DIAGNOSIS — M1990 Unspecified osteoarthritis, unspecified site: Secondary | ICD-10-CM | POA: Diagnosis not present

## 2021-08-28 DIAGNOSIS — I10 Essential (primary) hypertension: Secondary | ICD-10-CM | POA: Diagnosis not present

## 2021-08-28 DIAGNOSIS — M810 Age-related osteoporosis without current pathological fracture: Secondary | ICD-10-CM | POA: Diagnosis not present

## 2021-08-28 DIAGNOSIS — M2041 Other hammer toe(s) (acquired), right foot: Secondary | ICD-10-CM | POA: Diagnosis not present

## 2021-08-28 NOTE — Progress Notes (Signed)
**Note De-identified Maiorana Obfuscation** ? ? **Note De-Identified Rodriges Obfuscation** HPI: 81 y.o. female presenting today as a new patient for evaluation of curvature of the lesser digits of the right foot.  Patient states that she underwent 2 surgeries relatively close together including a RT knee replacement ([id number]) and RT bursa excision ([id number]).  Patient states that after the 2 surgery she noticed that her toes began curling under on her right foot.  They are not painful.  They do not cause instability.  She would like to have them evaluated today.  ? ?Past Medical History:  ?Diagnosis Date  ? Arthritis   ? Bursitis   ? Fibromyalgia   ? GERD (gastroesophageal reflux disease)   ? Hypertension   ? Osteoporosis   ? PONV (postoperative nausea and vomiting)   ? Restless leg syndrome   ? ? ?Past Surgical History:  ?Procedure Laterality Date  ? ABDOMINAL HYSTERECTOMY    ? APPENDECTOMY    ? CATARACT EXTRACTION  2013  ? CHOLECYSTECTOMY    ? COCCYX REMOVAL    ? COLONOSCOPY    ? EYE SURGERY    ? b/l  ? NECK SURGERY    ? fusion  ? SHOULDER ARTHROSCOPY    ? B/L  ? TONSILLECTOMY    ? TOTAL KNEE ARTHROPLASTY Right 11/20/2020  ? Procedure: RIGHT TOTAL KNEE ARTHROPLASTY;  Surgeon: Melrose Nakayama, MD;  Location: [facility];  Service: Orthopedics;  Laterality: Right;  ? TUBAL LIGATION    ? ? ?Allergies  ?Allergen Reactions  ? Fish Oil Swelling  ? Penicillins Swelling  ? ?  ?Physical Exam: ?General: The patient is alert and oriented x3 in no acute distress. ? ?Dermatology: Skin is warm, dry and supple bilateral lower extremities. Negative for open lesions or macerations. ? ?Vascular: Palpable pedal pulses bilaterally. Capillary refill within normal limits.  Negative for any significant edema or erythema ? ?Neurological: Light touch and protective threshold grossly intact ? ?Musculoskeletal Exam: No pedal deformities noted.  Reducible plantar contracture of the lesser digits 2-5 of the right foot likely due to recent history of right knee and hip surgery.  Muscle strength 5/5 all compartments ? ?Assessment: ?1.   Hammertoe contracture lesser digits right foot secondary to RT knee and RT hip surgery 2022 ? ? ?Plan of Care:  ?1. Patient evaluated. ?2.  The patient states that they do not cause instability and they are not painful she was only concerned.  I reassured the patient that as long as this does not create instability she is okay to continue to resume full activity no restrictions ?3.  Patient takes tramadol and meloxicam from her PCP.  Continue as needed ?4.  Return to clinic as needed ? ?  ?  ?Edrick Kins, DPM ?[place] ? ?Dr. Edrick Kins, DPM  ?  ?[address]                                        ?[place], [place] [address]                ?Office [phone]  ?Fax [phone] ? ? ? ? ?

## 2021-08-30 DIAGNOSIS — J189 Pneumonia, unspecified organism: Secondary | ICD-10-CM | POA: Diagnosis not present

## 2021-09-06 DIAGNOSIS — M818 Other osteoporosis without current pathological fracture: Secondary | ICD-10-CM | POA: Diagnosis not present

## 2021-09-18 DIAGNOSIS — Z961 Presence of intraocular lens: Secondary | ICD-10-CM | POA: Diagnosis not present

## 2021-10-16 DIAGNOSIS — M25561 Pain in right knee: Secondary | ICD-10-CM | POA: Diagnosis not present

## 2021-11-04 DIAGNOSIS — Z23 Encounter for immunization: Secondary | ICD-10-CM | POA: Diagnosis not present

## 2021-11-04 DIAGNOSIS — M797 Fibromyalgia: Secondary | ICD-10-CM | POA: Diagnosis not present

## 2021-11-04 DIAGNOSIS — I10 Essential (primary) hypertension: Secondary | ICD-10-CM | POA: Diagnosis not present

## 2021-11-04 DIAGNOSIS — G894 Chronic pain syndrome: Secondary | ICD-10-CM | POA: Diagnosis not present

## 2021-11-04 DIAGNOSIS — M810 Age-related osteoporosis without current pathological fracture: Secondary | ICD-10-CM | POA: Diagnosis not present

## 2021-11-20 DIAGNOSIS — Z96651 Presence of right artificial knee joint: Secondary | ICD-10-CM | POA: Diagnosis not present

## 2022-02-17 DIAGNOSIS — Z6826 Body mass index (BMI) 26.0-26.9, adult: Secondary | ICD-10-CM | POA: Diagnosis not present

## 2022-02-17 DIAGNOSIS — M533 Sacrococcygeal disorders, not elsewhere classified: Secondary | ICD-10-CM | POA: Diagnosis not present

## 2022-02-17 DIAGNOSIS — M79661 Pain in right lower leg: Secondary | ICD-10-CM | POA: Diagnosis not present

## 2022-03-10 DIAGNOSIS — M810 Age-related osteoporosis without current pathological fracture: Secondary | ICD-10-CM | POA: Diagnosis not present

## 2022-03-13 DIAGNOSIS — M25551 Pain in right hip: Secondary | ICD-10-CM | POA: Diagnosis not present

## 2022-03-13 DIAGNOSIS — M25552 Pain in left hip: Secondary | ICD-10-CM | POA: Diagnosis not present

## 2022-03-13 DIAGNOSIS — M545 Low back pain, unspecified: Secondary | ICD-10-CM | POA: Diagnosis not present

## 2022-03-17 NOTE — Therapy (Signed)
**Note De-Identified Herms Obfuscation** OUTPATIENT PHYSICAL THERAPY THORACOLUMBAR EVALUATION   Patient Name: Karen Dickerson MRN: [id number] DOB:02/17/1941, 81 y.o., female Today's Date: 03/17/2022    Past Medical History:  Diagnosis Date   Arthritis    Bursitis    Fibromyalgia    GERD (gastroesophageal reflux disease)    Hypertension    Osteoporosis    PONV (postoperative nausea and vomiting)    Restless leg syndrome    Past Surgical History:  Procedure Laterality Date   ABDOMINAL HYSTERECTOMY     APPENDECTOMY     CATARACT EXTRACTION  2013   CHOLECYSTECTOMY     COCCYX REMOVAL     COLONOSCOPY     EYE SURGERY     b/l   NECK SURGERY     fusion   SHOULDER ARTHROSCOPY     B/L   TONSILLECTOMY     TOTAL KNEE ARTHROPLASTY Right 11/20/2020   Procedure: RIGHT TOTAL KNEE ARTHROPLASTY;  Surgeon: Melrose Nakayama, MD;  Location: [facility];  Service: Orthopedics;  Laterality: Right;   TUBAL LIGATION     Patient Active Problem List   Diagnosis Date Noted   Primary osteoarthritis of right knee 11/20/2020   Restless legs syndrome (RLS) 01/03/2013    PCP: Kathyrn Lass  REFERRING PROVIDER: Inez Catalina  REFERRING DIAG: [phone], M25.551, M54.50  Rationale for Evaluation and Treatment Rehabilitation  THERAPY DIAG:  No diagnosis found.  ONSET DATE: June 2022  SUBJECTIVE:                                                                                                                                                                                           SUBJECTIVE STATEMENT: My back pain is straight across my lower back and my left hip hurts too, they are both about a 6/10 today. The more I move the more it hurts. I got an injection last Thursday on my hip, it helped a little bit. The pain gets worse throughout the day.    PERTINENT HISTORY:  R TKA May 2022, Fibromyalgia, HTN, Restless leg syndrome  PAIN:  Are you having pain? Yes: NPRS scale: 6/10 Pain location: low back and L hip Pain description: ache,  constant hot pain  Aggravating factors: moving and doing too much  Relieving factors: Tylenol   PRECAUTIONS: None  WEIGHT BEARING RESTRICTIONS No  FALLS:  Has patient fallen in last 6 months? No  LIVING ENVIRONMENT: Lives with: lives with their spouse Lives in: House/apartment Stairs: Yes: External: 2 steps; on right going up Has following equipment at home: Lobbyist, Environmental consultant - 2 wheeled, Wheelchair (manual), and None  OCCUPATION: Retired  PLOF: Independent **Note De-Identified Ottaviano Obfuscation** PATIENT GOALS  less pain and strengthen my L hip   OBJECTIVE:   PATIENT SURVEYS:  FOTO 29/100  SCREENING FOR RED FLAGS: Bowel or bladder incontinence: No Spinal tumors: No Cauda equina syndrome: No Compression fracture: No Abdominal aneurysm: No  COGNITION:  Overall cognitive status: Within functional limits for tasks assessed     SENSATION: WFL  MUSCLE LENGTH: Hamstrings: mild tightness bilaterally   POSTURE: rounded shoulders and forward head  PALPATION: TTP L1-L5 and sacrum  LUMBAR ROM:   Active  A/PROM  eval  Flexion WFL  Extension 50% feel it across the back  Right lateral flexion WFL  Left lateral flexion WFL  Right rotation 50% with pain  Left rotation 50% with pain   (Blank rows = not tested)  LOWER EXTREMITY ROM:  grossly WFL    LOWER EXTREMITY MMT:    MMT Right eval Left eval  Hip flexion 4+ 4  Hip extension 2+ 2+  Hip abduction 3+ 3+  Hip adduction    Hip internal rotation 3+ 3+  Hip external rotation 3+ 2+  Knee flexion 3+ 3+  Knee extension 4+ 4  Ankle dorsiflexion    Ankle plantarflexion    Ankle inversion    Ankle eversion     (Blank rows = not tested)  LUMBAR SPECIAL TESTS:  Straight leg raise test: Positive, Slump test: Negative, and FABER test: Positive  FUNCTIONAL TESTS:  5 times sit to stand: 22.54s Timed up and go (TUG): 22.05s   TODAY'S TREATMENT   Eval and POC   PATIENT EDUCATION:  Education details: POC and HEP Person educated:  Patient Education method: Explanation Education comprehension: verbalized understanding   HOME EXERCISE PROGRAM: Has a HEP from referring provider but has not tried them yet, will bring next visit to review  ASSESSMENT:  CLINICAL IMPRESSION: Patient is a 81 y.o. female who was seen today for physical therapy evaluation and treatment for low back and L hip pain. She is active around her household and likes to garden but has slowed down in the past year due to increased pain in her low back and L hip. She presents with some LE weakness and will benefit from skilled PT intervention to address her pain and strength deficits to provide overall relief to be able to complete ADLs and household chores with more ease.   REHAB POTENTIAL: Good  CLINICAL DECISION MAKING: Stable/uncomplicated  EVALUATION COMPLEXITY: Low   GOALS: Goals reviewed with patient? Yes  SHORT TERM GOALS: Target date: 04/29/22  Patient will be independent with initial HEP. Goal status: INITIAL   LONG TERM GOALS: Target date: 06/10/22  Patient will be independent with advanced/ongoing HEP to improve outcomes and carryover.  Goal status: INITIAL  2.  Patient will report at least 75% improvement in low back and L hip pain to improve QOL.  Baseline: 6/10 pain Goal status: INITIAL  3.  Patient will demonstrate improved functional LE strength as demonstrated by 4/5 or better in all muscle groups. Goal status: INITIAL  4.  Patient will report 4 on FOTO to demonstrate improved functional ability. Baseline: 29 Goal status: INITIAL  PLAN: PT FREQUENCY: 2x/week  PT DURATION: 12 weeks  PLANNED INTERVENTIONS: Therapeutic exercises, Therapeutic activity, Neuromuscular re-education, Balance training, Gait training, Patient/Family education, Self Care, Joint mobilization, Stair training, Dry Needling, Electrical stimulation, Spinal mobilization, Cryotherapy, Moist heat, Vasopneumatic device, Traction, Ionotophoresis  '4mg'$ /ml Dexamethasone, and Manual therapy.  PLAN FOR NEXT SESSION: start gym activities, low back stretching, core and hip strengthening **Note De-Identified Pearse Obfuscation** [place], [place] 03/17/2022, 2:26 PM

## 2022-03-18 ENCOUNTER — Ambulatory Visit: Payer: PPO | Attending: Sports Medicine

## 2022-03-18 DIAGNOSIS — M25652 Stiffness of left hip, not elsewhere classified: Secondary | ICD-10-CM | POA: Insufficient documentation

## 2022-03-18 DIAGNOSIS — M25552 Pain in left hip: Secondary | ICD-10-CM | POA: Insufficient documentation

## 2022-03-18 DIAGNOSIS — M5459 Other low back pain: Secondary | ICD-10-CM | POA: Insufficient documentation

## 2022-03-18 DIAGNOSIS — R2689 Other abnormalities of gait and mobility: Secondary | ICD-10-CM | POA: Diagnosis not present

## 2022-03-19 ENCOUNTER — Ambulatory Visit: Payer: PPO | Admitting: Physical Therapy

## 2022-03-19 ENCOUNTER — Encounter: Payer: Self-pay | Admitting: Physical Therapy

## 2022-03-19 DIAGNOSIS — M5459 Other low back pain: Secondary | ICD-10-CM | POA: Diagnosis not present

## 2022-03-19 DIAGNOSIS — M25552 Pain in left hip: Secondary | ICD-10-CM

## 2022-03-19 DIAGNOSIS — M25652 Stiffness of left hip, not elsewhere classified: Secondary | ICD-10-CM

## 2022-03-19 NOTE — Therapy (Signed)
**Note De-Identified Wenk Obfuscation** OUTPATIENT PHYSICAL THERAPY THORACOLUMBAR EVALUATION   Patient Name: Karen Dickerson MRN: [id number] DOB:1941/04/21, 81 y.o., female Today's Date: 03/19/2022   PT End of Session - 03/19/22 1015     Visit Number 2    Date for PT Re-Evaluation 06/10/22    PT Start Time 1015    PT Stop Time 1100    PT Time Calculation (min) 45 min    Activity Tolerance Patient tolerated treatment well    Behavior During Therapy WFL for tasks assessed/performed             Past Medical History:  Diagnosis Date   Arthritis    Bursitis    Fibromyalgia    GERD (gastroesophageal reflux disease)    Hypertension    Osteoporosis    PONV (postoperative nausea and vomiting)    Restless leg syndrome    Past Surgical History:  Procedure Laterality Date   ABDOMINAL HYSTERECTOMY     APPENDECTOMY     CATARACT EXTRACTION  2013   CHOLECYSTECTOMY     COCCYX REMOVAL     COLONOSCOPY     EYE SURGERY     b/l   NECK SURGERY     fusion   SHOULDER ARTHROSCOPY     B/L   TONSILLECTOMY     TOTAL KNEE ARTHROPLASTY Right 11/20/2020   Procedure: RIGHT TOTAL KNEE ARTHROPLASTY;  Surgeon: Melrose Nakayama, MD;  Location: [facility];  Service: Orthopedics;  Laterality: Right;   TUBAL LIGATION     Patient Active Problem List   Diagnosis Date Noted   Primary osteoarthritis of right knee 11/20/2020   Restless legs syndrome (RLS) 01/03/2013    PCP: Kathyrn Lass  REFERRING PROVIDER: Inez Catalina  REFERRING DIAG: X79.390, M25.551, M54.50  Rationale for Evaluation and Treatment Rehabilitation  THERAPY DIAG:  Other low back pain  Pain in left hip  Stiffness of left hip, not elsewhere classified  ONSET DATE: June 2022  SUBJECTIVE:                                                                                                                                                                                           SUBJECTIVE STATEMENT: R low back is really bad, Pt stated that it could be possibly sciatic  according to Dr. Sabra Heck. Hip are 5/10    PERTINENT HISTORY:  R TKA May 2022, Fibromyalgia, HTN, Restless leg syndrome  PAIN:  Are you having pain? Yes: NPRS scale: 5/10 Pain location: low back and L hip Pain description: ache, constant hot pain  Aggravating factors: moving and doing too much  Relieving factors: Tylenol   PRECAUTIONS: None  WEIGHT **Note De-Identified Scoggin Obfuscation** BEARING RESTRICTIONS No  FALLS:  Has patient fallen in last 6 months? No  LIVING ENVIRONMENT: Lives with: lives with their spouse Lives in: House/apartment Stairs: Yes: External: 2 steps; on right going up Has following equipment at home: Lobbyist, Environmental consultant - 2 wheeled, Wheelchair (manual), and None  OCCUPATION: Retired  PLOF: Independent  PATIENT GOALS  less pain and strengthen my L hip   OBJECTIVE:   PATIENT SURVEYS:  FOTO 29/100  SCREENING FOR RED FLAGS: Bowel or bladder incontinence: No Spinal tumors: No Cauda equina syndrome: No Compression fracture: No Abdominal aneurysm: No  COGNITION:  Overall cognitive status: Within functional limits for tasks assessed     SENSATION: WFL  MUSCLE LENGTH: Hamstrings: mild tightness bilaterally   POSTURE: rounded shoulders and forward head  PALPATION: TTP L1-L5 and sacrum  LUMBAR ROM:   Active  A/PROM  eval  Flexion WFL  Extension 50% feel it across the back  Right lateral flexion WFL  Left lateral flexion WFL  Right rotation 50% with pain  Left rotation 50% with pain   (Blank rows = not tested)  LOWER EXTREMITY ROM:  grossly WFL    LOWER EXTREMITY MMT:    MMT Right eval Left eval  Hip flexion 4+ 4  Hip extension 2+ 2+  Hip abduction 3+ 3+  Hip adduction    Hip internal rotation 3+ 3+  Hip external rotation 3+ 2+  Knee flexion 3+ 3+  Knee extension 4+ 4  Ankle dorsiflexion    Ankle plantarflexion    Ankle inversion    Ankle eversion     (Blank rows = not tested)  LUMBAR SPECIAL TESTS:  Straight leg raise test: Positive, Slump  test: Negative, and FABER test: Positive  FUNCTIONAL TESTS:  5 times sit to stand: 22.54s Timed up and go (TUG): 22.05s   TODAY'S TREATMENT  03/19/22 NuStep L3 x 6 min  S2S 2x10 with and without UE  Hip add ball squeez 2x10  Hip and green 2x10  Rows Green 2x10 Shoulder Ext red 2x10 Bridges 2x10  Hs and single K2C stretch   Eval and POC   PATIENT EDUCATION:  Education details: POC and HEP Person educated: Patient Education method: Explanation Education comprehension: verbalized understanding   HOME EXERCISE PROGRAM: Has a HEP from referring provider but has not tried them yet, will bring next visit to review  ASSESSMENT:  CLINICAL IMPRESSION: Patient enters clinic reporting that she is doing ok despite having pain. Pt tolerated an initial progression to TE well evident by no subjective reports of increase pain. Pt actually stated she had less pain post session.  Postural cues required with rows and extensions. Limited elevation noted with bridges.   REHAB POTENTIAL: Good  CLINICAL DECISION MAKING: Stable/uncomplicated  EVALUATION COMPLEXITY: Low   GOALS: Goals reviewed with patient? Yes  SHORT TERM GOALS: Target date: 04/29/22  Patient will be independent with initial HEP. Goal status: INITIAL   LONG TERM GOALS: Target date: 06/10/22  Patient will be independent with advanced/ongoing HEP to improve outcomes and carryover.  Goal status: INITIAL  2.  Patient will report at least 75% improvement in low back and L hip pain to improve QOL.  Baseline: 6/10 pain Goal status: INITIAL  3.  Patient will demonstrate improved functional LE strength as demonstrated by 4/5 or better in all muscle groups. Goal status: INITIAL  4.  Patient will report 57 on FOTO to demonstrate improved functional ability. Baseline: 29 Goal status: INITIAL  PLAN: PT **Note De-Identified Konigsberg Obfuscation** FREQUENCY: 2x/week  PT DURATION: 12 weeks  PLANNED INTERVENTIONS: Therapeutic exercises, Therapeutic activity,  Neuromuscular re-education, Balance training, Gait training, Patient/Family education, Self Care, Joint mobilization, Stair training, Dry Needling, Electrical stimulation, Spinal mobilization, Cryotherapy, Moist heat, Vasopneumatic device, Traction, Ionotophoresis '4mg'$ /ml Dexamethasone, and Manual therapy.  PLAN FOR NEXT SESSION: start gym activities, low back stretching, core and hip strengthening   Scot Jun, PTA 03/19/2022, 10:15 AM

## 2022-03-28 ENCOUNTER — Ambulatory Visit: Payer: PPO | Admitting: Physical Therapy

## 2022-03-28 NOTE — Therapy (Signed)
**Note De-Identified Patino Obfuscation** OUTPATIENT PHYSICAL THERAPY THORACOLUMBAR TREATMENT   Patient Name: Karen Dickerson MRN: [id number] DOB:04/10/1941, 81 y.o., female Today's Date: 03/31/2022   PT End of Session - 03/31/22 1102     Visit Number 3    Date for PT Re-Evaluation 06/10/22    PT Start Time 1100    PT Stop Time 1145    PT Time Calculation (min) 45 min    Activity Tolerance Patient tolerated treatment well    Behavior During Therapy WFL for tasks assessed/performed              Past Medical History:  Diagnosis Date   Arthritis    Bursitis    Fibromyalgia    GERD (gastroesophageal reflux disease)    Hypertension    Osteoporosis    PONV (postoperative nausea and vomiting)    Restless leg syndrome    Past Surgical History:  Procedure Laterality Date   ABDOMINAL HYSTERECTOMY     APPENDECTOMY     CATARACT EXTRACTION  2013   CHOLECYSTECTOMY     COCCYX REMOVAL     COLONOSCOPY     EYE SURGERY     b/l   NECK SURGERY     fusion   SHOULDER ARTHROSCOPY     B/L   TONSILLECTOMY     TOTAL KNEE ARTHROPLASTY Right 11/20/2020   Procedure: RIGHT TOTAL KNEE ARTHROPLASTY;  Surgeon: Melrose Nakayama, MD;  Location: [facility];  Service: Orthopedics;  Laterality: Right;   TUBAL LIGATION     Patient Active Problem List   Diagnosis Date Noted   Primary osteoarthritis of right knee 11/20/2020   Restless legs syndrome (RLS) 01/03/2013    PCP: Kathyrn Lass  REFERRING PROVIDER: Inez Catalina  REFERRING DIAG: W09.811, M25.551, M54.50  Rationale for Evaluation and Treatment Rehabilitation  THERAPY DIAG:  Other low back pain  Pain in left hip  Stiffness of left hip, not elsewhere classified  Other abnormalities of gait and mobility  ONSET DATE: June 2022  SUBJECTIVE:                                                                                                                                                                                           SUBJECTIVE STATEMENT: Pain is a 6/10, but my low  back on the right side is also painful.    PERTINENT HISTORY:  R TKA May 2022, Fibromyalgia, HTN, Restless leg syndrome  PAIN:  Are you having pain? Yes: NPRS scale: 6/10 Pain location: low back and L hip Pain description: ache, constant hot pain  Aggravating factors: moving and doing too much  Relieving factors: Tylenol   PRECAUTIONS: None **Note De-Identified Arkwright Obfuscation** WEIGHT BEARING RESTRICTIONS No  FALLS:  Has patient fallen in last 6 months? No  LIVING ENVIRONMENT: Lives with: lives with their spouse Lives in: House/apartment Stairs: Yes: External: 2 steps; on right going up Has following equipment at home: Lobbyist, Environmental consultant - 2 wheeled, Wheelchair (manual), and None  OCCUPATION: Retired  PLOF: Independent  PATIENT GOALS  less pain and strengthen my L hip   OBJECTIVE:   PATIENT SURVEYS:  FOTO 29/100  SCREENING FOR RED FLAGS: Bowel or bladder incontinence: No Spinal tumors: No Cauda equina syndrome: No Compression fracture: No Abdominal aneurysm: No  COGNITION:  Overall cognitive status: Within functional limits for tasks assessed     SENSATION: WFL  MUSCLE LENGTH: Hamstrings: mild tightness bilaterally   POSTURE: rounded shoulders and forward head  PALPATION: TTP L1-L5 and sacrum  LUMBAR ROM:   Active  A/PROM  eval  Flexion WFL  Extension 50% feel it across the back  Right lateral flexion WFL  Left lateral flexion WFL  Right rotation 50% with pain  Left rotation 50% with pain   (Blank rows = not tested)  LOWER EXTREMITY ROM:  grossly WFL    LOWER EXTREMITY MMT:    MMT Right eval Left eval  Hip flexion 4+ 4  Hip extension 2+ 2+  Hip abduction 3+ 3+  Hip adduction    Hip internal rotation 3+ 3+  Hip external rotation 3+ 2+  Knee flexion 3+ 3+  Knee extension 4+ 4  Ankle dorsiflexion    Ankle plantarflexion    Ankle inversion    Ankle eversion     (Blank rows = not tested)  LUMBAR SPECIAL TESTS:  Straight leg raise test: Positive, Slump  test: Negative, and FABER test: Positive  FUNCTIONAL TESTS:  5 times sit to stand: 22.54s Timed up and go (TUG): 22.05s   TODAY'S TREATMENT  03/31/22 Bike L1 x79mns  S2S 2x10  Bridges 2x10  Supine clamshells 2x10 SLR 2x10 2# Feet on pball rotations and knees to chest x10 each  3 way hip 2# x10 LAQ 2# 2x10 Ball squeezes 2x10    03/19/22 NuStep L3 x 6 min  S2S 2x10 with and without UE  Hip add ball squeez 2x10  Hip and green 2x10  Rows Green 2x10 Shoulder Ext red 2x10 Bridges 2x10  Hs and single K2C stretch   Eval and POC   PATIENT EDUCATION:  Education details: POC and HEP Person educated: Patient Education method: Explanation Education comprehension: verbalized understanding   HOME EXERCISE PROGRAM: STS, hip isometrics, pelvic tilts, SK2C stretch  ASSESSMENT:  CLINICAL IMPRESSION: Patient enters clinic reporting that she is doing ok despite having pain in her L hip and low back. Has most difficulty with standing hip exercise especially when having to weight bear through the LLE. Unable to complete another set of 3 way hip due to pain. Will work on more functional hip strengthening next visit.   REHAB POTENTIAL: Good  CLINICAL DECISION MAKING: Stable/uncomplicated  EVALUATION COMPLEXITY: Low   GOALS: Goals reviewed with patient? Yes  SHORT TERM GOALS: Target date: 04/29/22  Patient will be independent with initial HEP. Goal status: INITIAL   LONG TERM GOALS: Target date: 06/10/22  Patient will be independent with advanced/ongoing HEP to improve outcomes and carryover.  Goal status: INITIAL  2.  Patient will report at least 75% improvement in low back and L hip pain to improve QOL.  Baseline: 6/10 pain Goal status: INITIAL  3.  Patient will demonstrate improved **Note De-Identified Tayag Obfuscation** functional LE strength as demonstrated by 4/5 or better in all muscle groups. Goal status: INITIAL  4.  Patient will report 19 on FOTO to demonstrate improved functional  ability. Baseline: 29 Goal status: INITIAL  PLAN: PT FREQUENCY: 2x/week  PT DURATION: 12 weeks  PLANNED INTERVENTIONS: Therapeutic exercises, Therapeutic activity, Neuromuscular re-education, Balance training, Gait training, Patient/Family education, Self Care, Joint mobilization, Stair training, Dry Needling, Electrical stimulation, Spinal mobilization, Cryotherapy, Moist heat, Vasopneumatic device, Traction, Ionotophoresis '4mg'$ /ml Dexamethasone, and Manual therapy.  PLAN FOR NEXT SESSION: lateral band walks, resisted gait, leg ext and HS curls    Andris Baumann, PT 03/31/2022, 11:42 AM

## 2022-03-31 ENCOUNTER — Ambulatory Visit: Payer: PPO | Attending: Sports Medicine

## 2022-03-31 DIAGNOSIS — R2689 Other abnormalities of gait and mobility: Secondary | ICD-10-CM

## 2022-03-31 DIAGNOSIS — M25552 Pain in left hip: Secondary | ICD-10-CM | POA: Diagnosis not present

## 2022-03-31 DIAGNOSIS — M5459 Other low back pain: Secondary | ICD-10-CM | POA: Diagnosis not present

## 2022-03-31 DIAGNOSIS — M25652 Stiffness of left hip, not elsewhere classified: Secondary | ICD-10-CM

## 2022-04-02 DIAGNOSIS — M8589 Other specified disorders of bone density and structure, multiple sites: Secondary | ICD-10-CM | POA: Diagnosis not present

## 2022-04-02 DIAGNOSIS — Z1231 Encounter for screening mammogram for malignant neoplasm of breast: Secondary | ICD-10-CM | POA: Diagnosis not present

## 2022-04-02 DIAGNOSIS — Z780 Asymptomatic menopausal state: Secondary | ICD-10-CM | POA: Diagnosis not present

## 2022-04-03 ENCOUNTER — Ambulatory Visit: Payer: PPO

## 2022-04-03 DIAGNOSIS — M5459 Other low back pain: Secondary | ICD-10-CM | POA: Diagnosis not present

## 2022-04-03 DIAGNOSIS — R2689 Other abnormalities of gait and mobility: Secondary | ICD-10-CM

## 2022-04-03 DIAGNOSIS — M25652 Stiffness of left hip, not elsewhere classified: Secondary | ICD-10-CM

## 2022-04-03 DIAGNOSIS — M25552 Pain in left hip: Secondary | ICD-10-CM

## 2022-04-03 NOTE — Therapy (Signed)
**Note De-Identified [place]** OUTPATIENT PHYSICAL THERAPY THORACOLUMBAR TREATMENT   Patient Name: Karen Dickerson Ranganathan MRN: [id number] DOB:May 23, 1941, 81 y.o., female Today's Date: 04/03/2022   PT End of Session - 04/03/22 1057     Visit Number 4    Date for PT Re-Evaluation 06/10/22    PT Start Time 1100    PT Stop Time 1140    PT Time Calculation (min) 40 min    Activity Tolerance Patient tolerated treatment well    Behavior During Therapy WFL for tasks assessed/performed               Past Medical History:  Diagnosis Date   Arthritis    Bursitis    Fibromyalgia    GERD (gastroesophageal reflux disease)    Hypertension    Osteoporosis    PONV (postoperative nausea and vomiting)    Restless leg syndrome    Past Surgical History:  Procedure Laterality Date   ABDOMINAL HYSTERECTOMY     APPENDECTOMY     CATARACT EXTRACTION  2013   CHOLECYSTECTOMY     COCCYX REMOVAL     COLONOSCOPY     EYE SURGERY     b/l   NECK SURGERY     fusion   SHOULDER ARTHROSCOPY     B/L   TONSILLECTOMY     TOTAL KNEE ARTHROPLASTY Right 11/20/2020   Procedure: RIGHT TOTAL KNEE ARTHROPLASTY;  Surgeon: Melrose Nakayama, MD;  Location: [facility];  Service: Orthopedics;  Laterality: Right;   TUBAL LIGATION     Patient Active Problem List   Diagnosis Date Noted   Primary osteoarthritis of right knee 11/20/2020   Restless legs syndrome (RLS) 01/03/2013    PCP: Kathyrn Lass  REFERRING PROVIDER: Inez Catalina  REFERRING DIAG: I94.854, M25.551, M54.50  Rationale for Evaluation and Treatment Rehabilitation  THERAPY DIAG:  Pain in left hip  Stiffness of left hip, not elsewhere classified  Other abnormalities of gait and mobility  Other low back pain  ONSET DATE: June 2022  SUBJECTIVE:                                                                                                                                                                                           SUBJECTIVE STATEMENT: I am feeling a lot  better, I was sore after last visit but am feeling so much better today.    PERTINENT HISTORY:  R TKA May 2022, Fibromyalgia, HTN, Restless leg syndrome  PAIN:  Are you having pain? Yes: NPRS scale: 2/10 Pain location: low back and L hip Pain description: ache, constant hot pain  Aggravating factors: moving and doing too much  Relieving factors: **Note De-Identified [place] [facility]** Tylenol   PRECAUTIONS: None  WEIGHT BEARING RESTRICTIONS No  FALLS:  Has patient fallen in last 6 months? No  LIVING ENVIRONMENT: Lives with: lives with their spouse Lives in: House/apartment Stairs: Yes: External: 2 steps; on right going up Has following equipment at home: Lobbyist, Environmental consultant - 2 wheeled, Wheelchair (manual), and None  OCCUPATION: Retired  PLOF: Independent  PATIENT GOALS  less pain and strengthen my L hip   OBJECTIVE:   PATIENT SURVEYS:  FOTO 29/100  SCREENING FOR RED FLAGS: Bowel or bladder incontinence: No Spinal tumors: No Cauda equina syndrome: No Compression fracture: No Abdominal aneurysm: No  COGNITION:  Overall cognitive status: Within functional limits for tasks assessed     SENSATION: WFL  MUSCLE LENGTH: Hamstrings: mild tightness bilaterally   POSTURE: rounded shoulders and forward head  PALPATION: TTP L1-L5 and sacrum  LUMBAR ROM:   Active  A/PROM  eval  Flexion WFL  Extension 50% feel it across the back  Right lateral flexion WFL  Left lateral flexion WFL  Right rotation 50% with pain  Left rotation 50% with pain   (Blank rows = not tested)  LOWER EXTREMITY ROM:  grossly WFL    LOWER EXTREMITY MMT:    MMT Right eval Left eval  Hip flexion 4+ 4  Hip extension 2+ 2+  Hip abduction 3+ 3+  Hip adduction    Hip internal rotation 3+ 3+  Hip external rotation 3+ 2+  Knee flexion 3+ 3+  Knee extension 4+ 4  Ankle dorsiflexion    Ankle plantarflexion    Ankle inversion    Ankle eversion     (Blank rows = not tested)  LUMBAR SPECIAL TESTS:  Straight  leg raise test: Positive, Slump test: Negative, and FABER test: Positive  FUNCTIONAL TESTS:  5 times sit to stand: 22.54s Timed up and go (TUG): 22.05s   TODAY'S TREATMENT  04/03/22 NuStep L5 x46mns  Resisted gait 20# forwards and side steps x4 Leg ext 5# 2x10  HS curls 15# 2x10  Step ups 6" x10 each leg  Lateral band walks    03/31/22 Bike L1 x638ms  S2S 2x10  Bridges 2x10  Supine clamshells 2x10 SLR 2x10 2# Feet on pball rotations and knees to chest x10 each  3 way hip 2# x10 LAQ 2# 2x10 Ball squeezes 2x10    03/19/22 NuStep L3 x 6 min  S2S 2x10 with and without UE  Hip add ball squeez 2x10  Hip and green 2x10  Rows Green 2x10 Shoulder Ext red 2x10 Bridges 2x10  Hs and single K2C stretch   Eval and POC   PATIENT EDUCATION:  Education details: POC and HEP Person educated: Patient Education method: Explanation Education comprehension: verbalized understanding   HOME EXERCISE PROGRAM: STS, hip isometrics, pelvic tilts, SK2C stretch  ASSESSMENT:  CLINICAL IMPRESSION: Patient returns with decreased pain levels today, states that she was sore after last visit but it really helped after a few days. We continued working on hip and LE strengthening today. Most difficulty with step ups due to pain.   REHAB POTENTIAL: Good  CLINICAL DECISION MAKING: Stable/uncomplicated  EVALUATION COMPLEXITY: Low   GOALS: Goals reviewed with patient? Yes  SHORT TERM GOALS: Target date: 04/29/22  Patient will be independent with initial HEP. Goal status: INITIAL   LONG TERM GOALS: Target date: 06/10/22  Patient will be independent with advanced/ongoing HEP to improve outcomes and carryover.  Goal status: INITIAL  2.  Patient will report at least **Note De-Identified [place] [facility]** 75% improvement in low back and L hip pain to improve QOL.  Baseline: 6/10 pain Goal status: INITIAL  3.  Patient will demonstrate improved functional LE strength as demonstrated by 4/5 or better in all muscle  groups. Goal status: INITIAL  4.  Patient will report 29 on FOTO to demonstrate improved functional ability. Baseline: 29 Goal status: INITIAL  PLAN: PT FREQUENCY: 2x/week  PT DURATION: 12 weeks  PLANNED INTERVENTIONS: Therapeutic exercises, Therapeutic activity, Neuromuscular re-education, Balance training, Gait training, Patient/Family education, Self Care, Joint mobilization, Stair training, Dry Needling, Electrical stimulation, Spinal mobilization, Cryotherapy, Moist heat, Vasopneumatic device, Traction, Ionotophoresis '4mg'$ /ml Dexamethasone, and Manual therapy.  PLAN FOR NEXT SESSION:    Andris Baumann, PT 04/03/2022, 11:41 AM

## 2022-04-15 ENCOUNTER — Ambulatory Visit: Payer: PPO

## 2022-04-17 ENCOUNTER — Ambulatory Visit: Payer: PPO

## 2022-04-30 DIAGNOSIS — G2581 Restless legs syndrome: Secondary | ICD-10-CM | POA: Diagnosis not present

## 2022-04-30 DIAGNOSIS — M899 Disorder of bone, unspecified: Secondary | ICD-10-CM | POA: Diagnosis not present

## 2022-04-30 DIAGNOSIS — M1990 Unspecified osteoarthritis, unspecified site: Secondary | ICD-10-CM | POA: Diagnosis not present

## 2022-04-30 DIAGNOSIS — I10 Essential (primary) hypertension: Secondary | ICD-10-CM | POA: Diagnosis not present

## 2022-04-30 DIAGNOSIS — M818 Other osteoporosis without current pathological fracture: Secondary | ICD-10-CM | POA: Diagnosis not present

## 2022-04-30 DIAGNOSIS — Z6826 Body mass index (BMI) 26.0-26.9, adult: Secondary | ICD-10-CM | POA: Diagnosis not present

## 2022-05-12 DIAGNOSIS — J029 Acute pharyngitis, unspecified: Secondary | ICD-10-CM | POA: Diagnosis not present

## 2022-05-12 DIAGNOSIS — R3915 Urgency of urination: Secondary | ICD-10-CM | POA: Diagnosis not present

## 2022-05-12 DIAGNOSIS — Z6826 Body mass index (BMI) 26.0-26.9, adult: Secondary | ICD-10-CM | POA: Diagnosis not present

## 2022-05-12 DIAGNOSIS — R300 Dysuria: Secondary | ICD-10-CM | POA: Diagnosis not present

## 2022-05-27 DIAGNOSIS — I10 Essential (primary) hypertension: Secondary | ICD-10-CM | POA: Diagnosis not present

## 2022-05-27 DIAGNOSIS — M818 Other osteoporosis without current pathological fracture: Secondary | ICD-10-CM | POA: Diagnosis not present

## 2022-06-05 DIAGNOSIS — Z6826 Body mass index (BMI) 26.0-26.9, adult: Secondary | ICD-10-CM | POA: Diagnosis not present

## 2022-06-05 DIAGNOSIS — Z0000 Encounter for general adult medical examination without abnormal findings: Secondary | ICD-10-CM | POA: Diagnosis not present

## 2022-06-05 DIAGNOSIS — Z1389 Encounter for screening for other disorder: Secondary | ICD-10-CM | POA: Diagnosis not present

## 2022-07-04 DIAGNOSIS — R350 Frequency of micturition: Secondary | ICD-10-CM | POA: Diagnosis not present

## 2022-07-04 DIAGNOSIS — Z87440 Personal history of urinary (tract) infections: Secondary | ICD-10-CM | POA: Diagnosis not present

## 2022-07-04 DIAGNOSIS — R300 Dysuria: Secondary | ICD-10-CM | POA: Diagnosis not present

## 2022-07-04 DIAGNOSIS — Z6827 Body mass index (BMI) 27.0-27.9, adult: Secondary | ICD-10-CM | POA: Diagnosis not present

## 2022-07-04 DIAGNOSIS — R3915 Urgency of urination: Secondary | ICD-10-CM | POA: Diagnosis not present

## 2022-07-30 DIAGNOSIS — G894 Chronic pain syndrome: Secondary | ICD-10-CM | POA: Diagnosis not present

## 2022-07-30 DIAGNOSIS — R7303 Prediabetes: Secondary | ICD-10-CM | POA: Diagnosis not present

## 2022-07-30 DIAGNOSIS — Z9989 Dependence on other enabling machines and devices: Secondary | ICD-10-CM | POA: Diagnosis not present

## 2022-07-30 DIAGNOSIS — Z6827 Body mass index (BMI) 27.0-27.9, adult: Secondary | ICD-10-CM | POA: Diagnosis not present

## 2022-08-13 IMAGING — DX DG CHEST 2V
2 series · 2 of 2 positions shown · non-contrast
Comparison: None.

CLINICAL DATA: 79-year-old female with a history upcoming knee
surgery

EXAM:
CHEST - 2 VIEW

[chest pa]
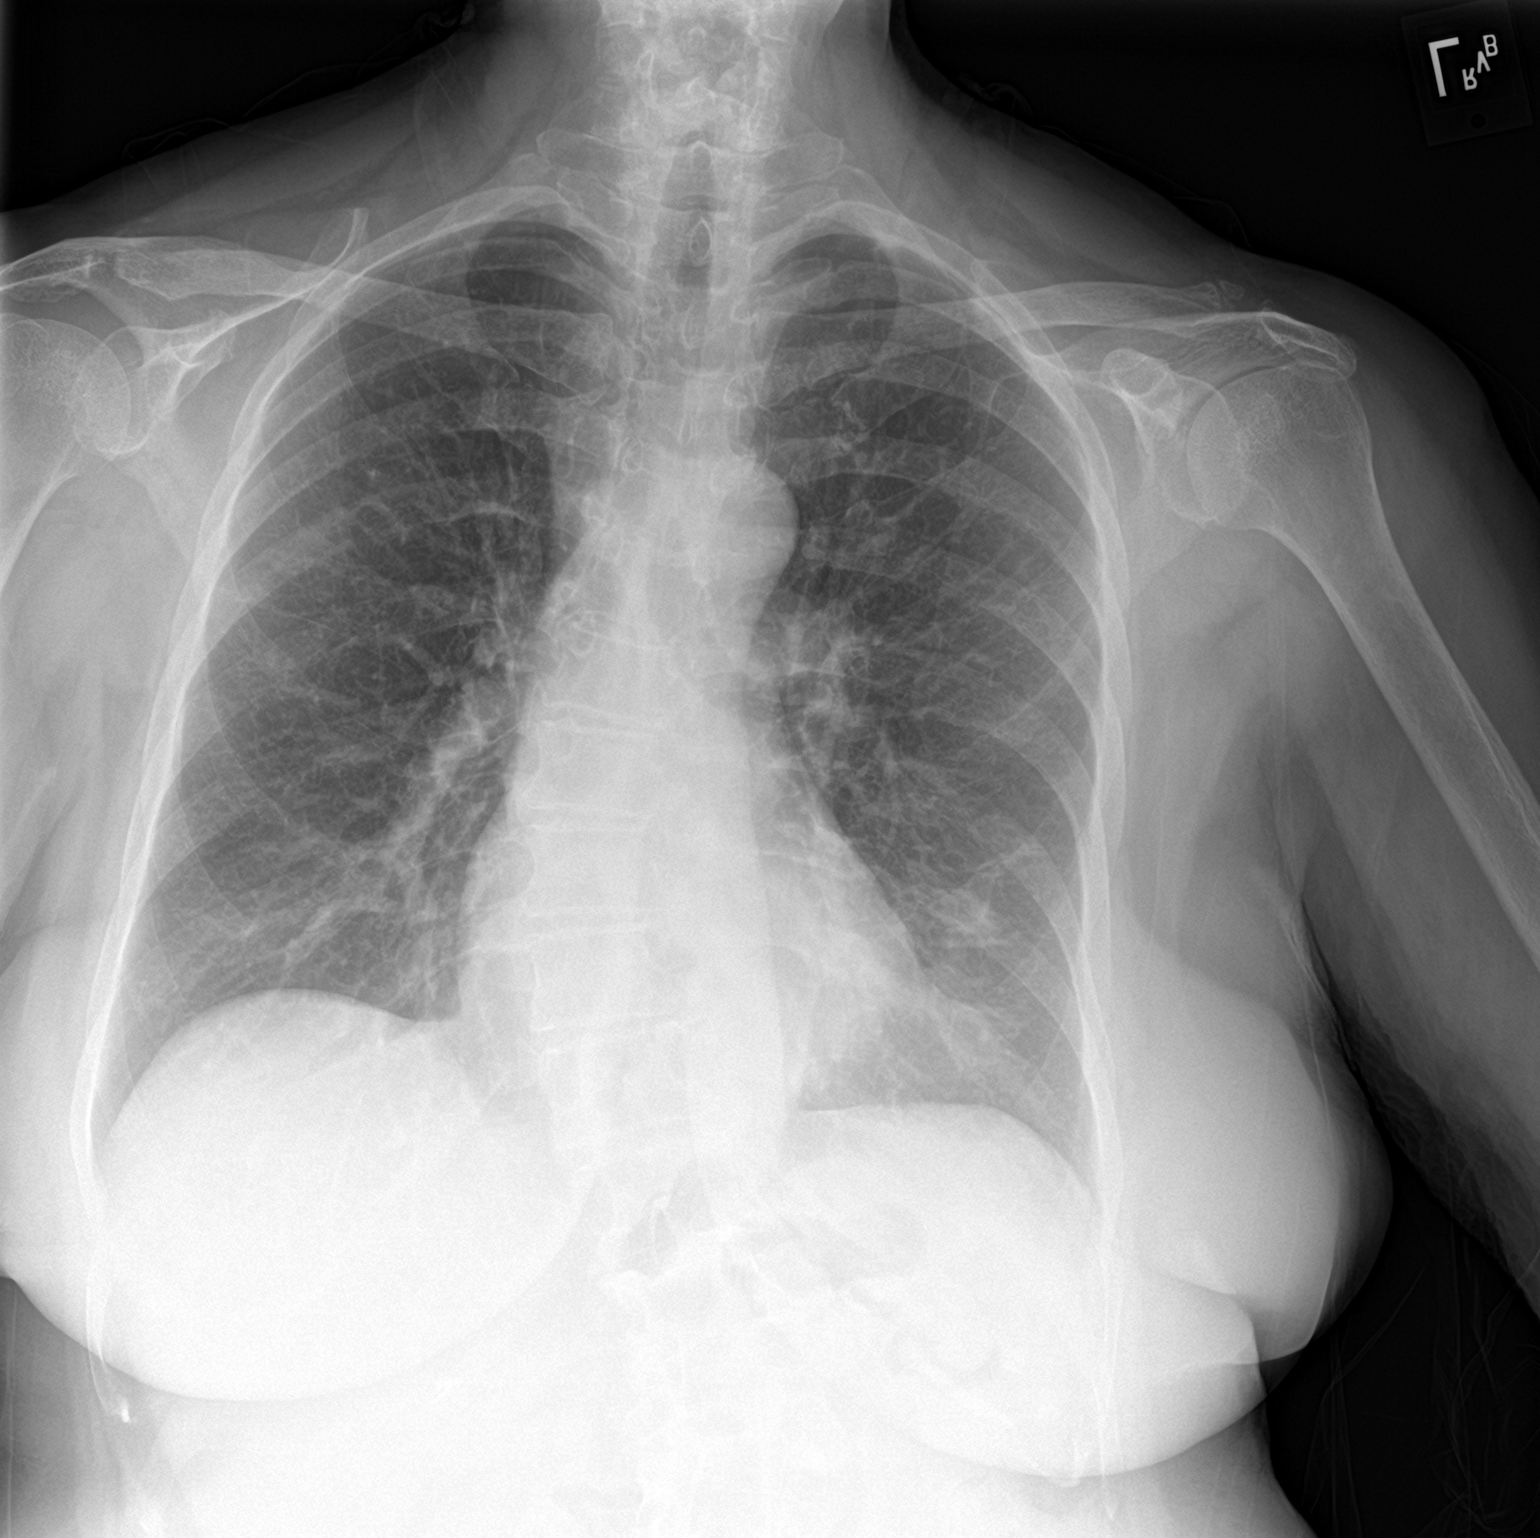

[chest lat]
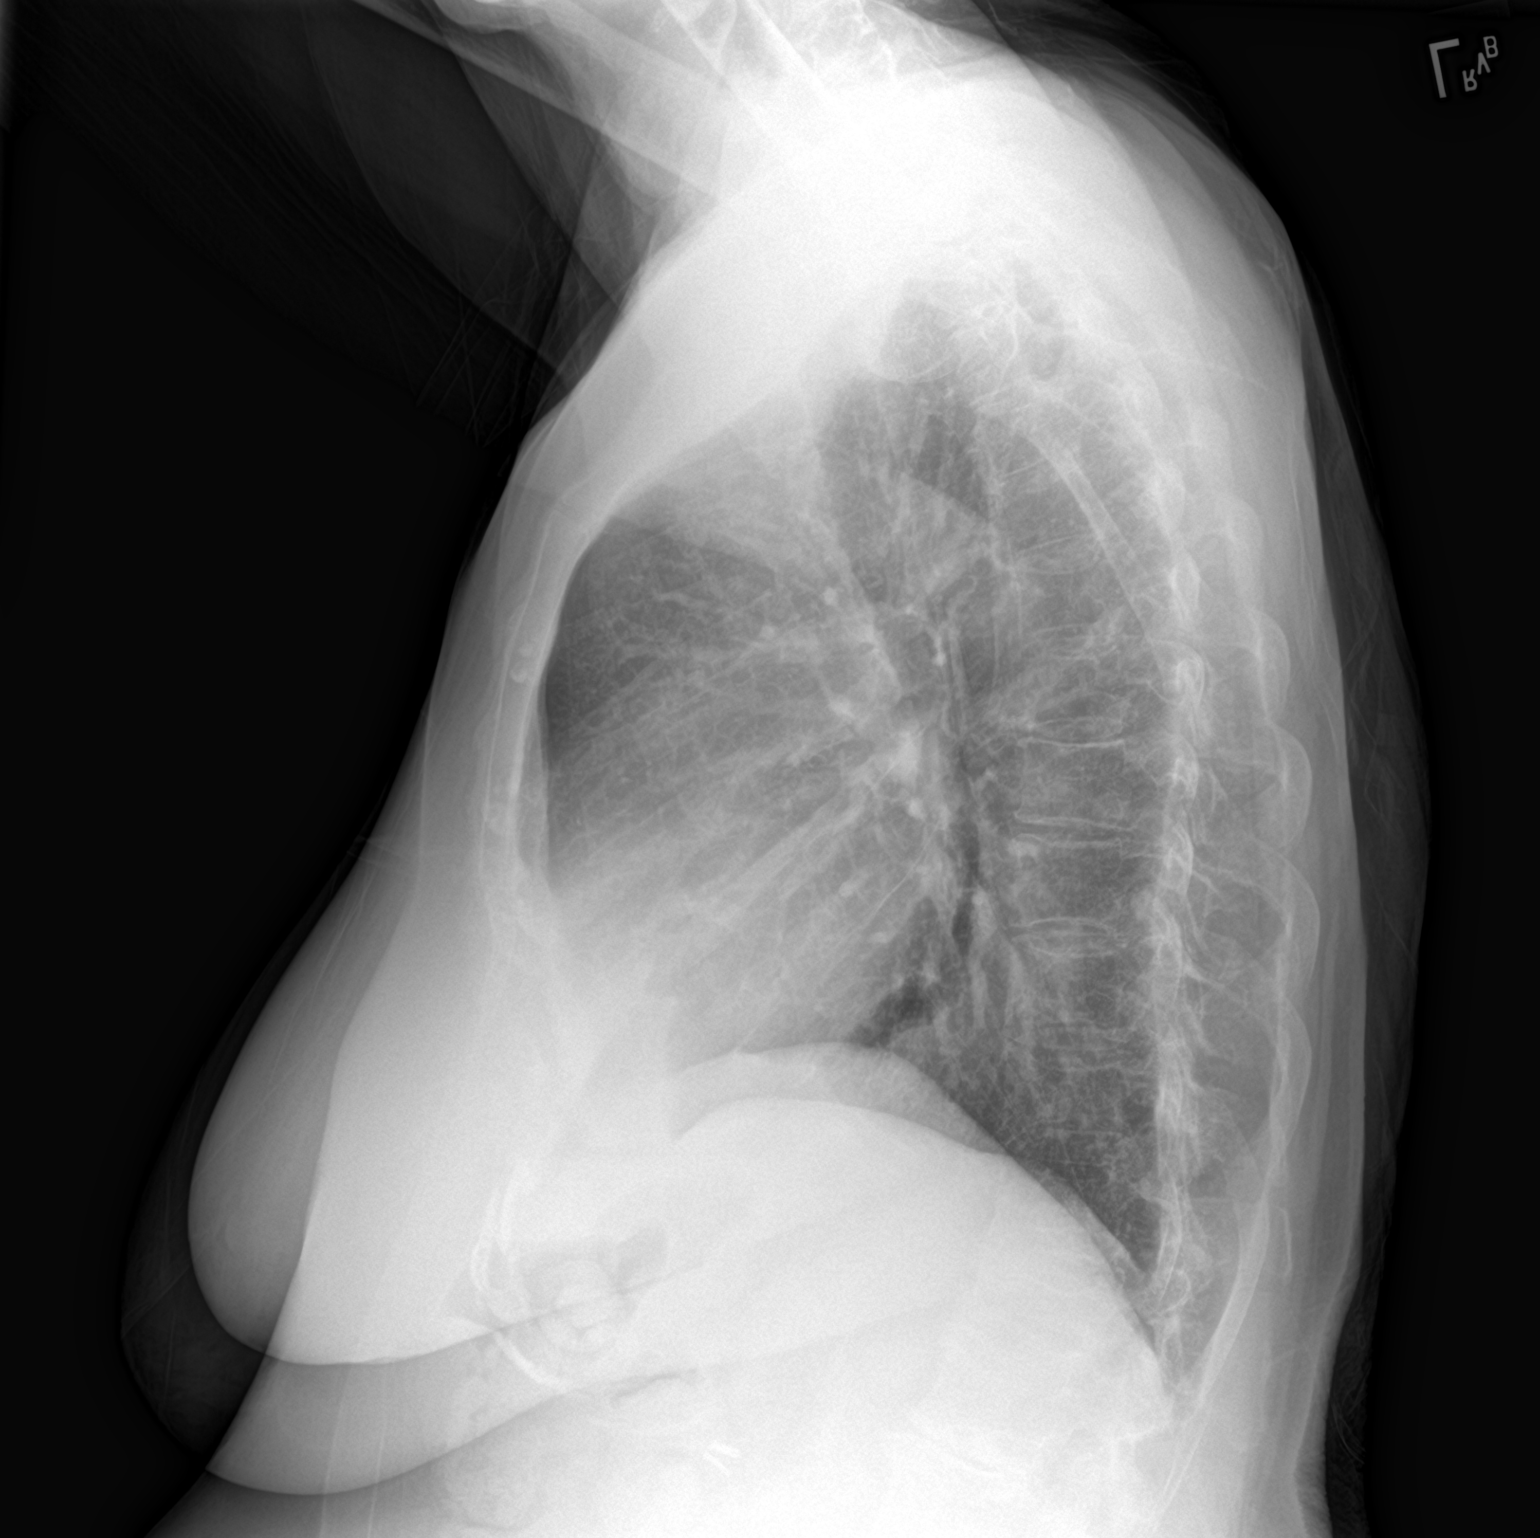

[2 of 2 positions shown; findings below may reference images not displayed]

FINDINGS: Cardiomediastinal silhouette within normal limits in size and
contour. No evidence of central vascular congestion. No interlobular
septal thickening. No pneumothorax or pleural effusion. Coarsened
interstitial markings, with vague reticulonodular opacities
bilaterally with no comparison available.

No confluent airspace disease.

No acute displaced fracture. Degenerative changes of the spine. Mild
scoliotic curvature.
IMPRESSION: Nonspecific reticulonodular opacities of the lungs with no available
comparison, likely chronic changes in the absence of symptoms. No
evidence of pulmonary edema or lobar pneumonia.

## 2022-09-10 DIAGNOSIS — M818 Other osteoporosis without current pathological fracture: Secondary | ICD-10-CM | POA: Diagnosis not present

## 2022-09-22 DIAGNOSIS — D3132 Benign neoplasm of left choroid: Secondary | ICD-10-CM | POA: Diagnosis not present

## 2022-09-22 DIAGNOSIS — Z961 Presence of intraocular lens: Secondary | ICD-10-CM | POA: Diagnosis not present

## 2022-11-04 DIAGNOSIS — M1990 Unspecified osteoarthritis, unspecified site: Secondary | ICD-10-CM | POA: Diagnosis not present

## 2022-11-04 DIAGNOSIS — R7303 Prediabetes: Secondary | ICD-10-CM | POA: Diagnosis not present

## 2022-11-04 DIAGNOSIS — M810 Age-related osteoporosis without current pathological fracture: Secondary | ICD-10-CM | POA: Diagnosis not present

## 2022-11-04 DIAGNOSIS — Z6827 Body mass index (BMI) 27.0-27.9, adult: Secondary | ICD-10-CM | POA: Diagnosis not present

## 2022-11-04 DIAGNOSIS — G2581 Restless legs syndrome: Secondary | ICD-10-CM | POA: Diagnosis not present

## 2022-11-04 DIAGNOSIS — G894 Chronic pain syndrome: Secondary | ICD-10-CM | POA: Diagnosis not present

## 2022-11-04 DIAGNOSIS — N289 Disorder of kidney and ureter, unspecified: Secondary | ICD-10-CM | POA: Diagnosis not present

## 2022-11-04 DIAGNOSIS — I10 Essential (primary) hypertension: Secondary | ICD-10-CM | POA: Diagnosis not present

## 2022-12-09 DIAGNOSIS — Z471 Aftercare following joint replacement surgery: Secondary | ICD-10-CM | POA: Diagnosis not present

## 2022-12-09 DIAGNOSIS — M1711 Unilateral primary osteoarthritis, right knee: Secondary | ICD-10-CM | POA: Diagnosis not present

## 2022-12-09 DIAGNOSIS — M170 Bilateral primary osteoarthritis of knee: Secondary | ICD-10-CM | POA: Diagnosis not present

## 2022-12-09 DIAGNOSIS — Z96651 Presence of right artificial knee joint: Secondary | ICD-10-CM | POA: Diagnosis not present

## 2022-12-09 DIAGNOSIS — M1712 Unilateral primary osteoarthritis, left knee: Secondary | ICD-10-CM | POA: Diagnosis not present

## 2022-12-15 DIAGNOSIS — D649 Anemia, unspecified: Secondary | ICD-10-CM | POA: Diagnosis not present

## 2023-01-01 IMAGING — CT CT PELVIS W/O CM
2 of 3 series · 12 of 46 positions shown, 14 images · non-contrast
Comparison: None.

CLINICAL DATA: Lipoma, follow-up

EXAM:
CT PELVIS WITHOUT CONTRAST
TECHNIQUE: Multidetector CT imaging of the pelvis was performed following the
standard protocol without intravenous contrast.

[Series 2: pelvis 5.00 br40 s3 · axial · 0.56mm/px · z∈[+1305,+1505]mm · 9 of 48 slices shown, 11 images]
[im 4/48  soft-tissue]
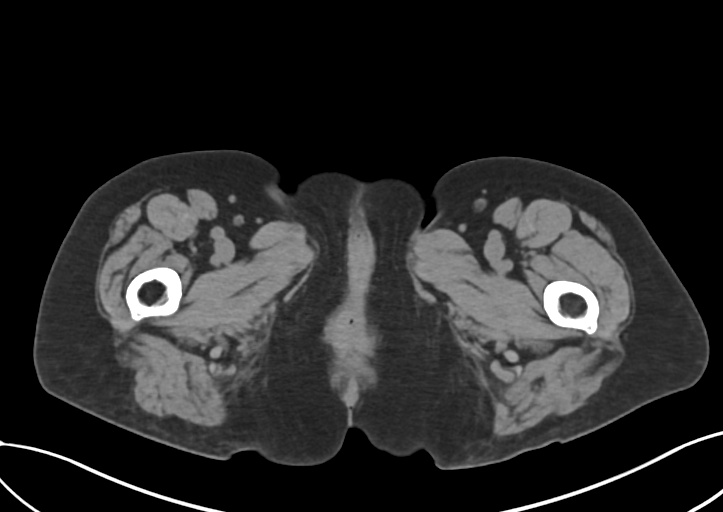
[im 4/48  bone]
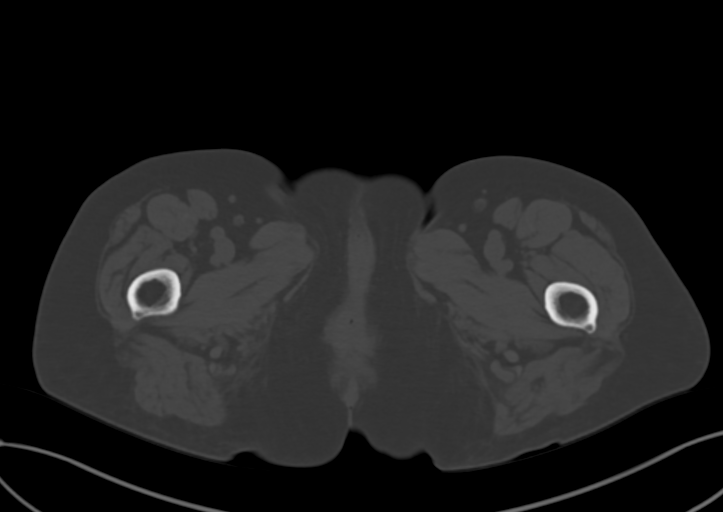
[im 10/48  soft-tissue]
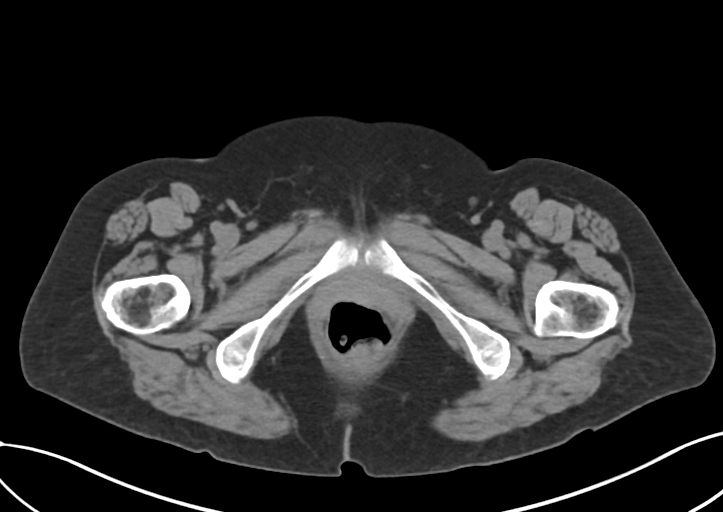
[im 14/48  soft-tissue]
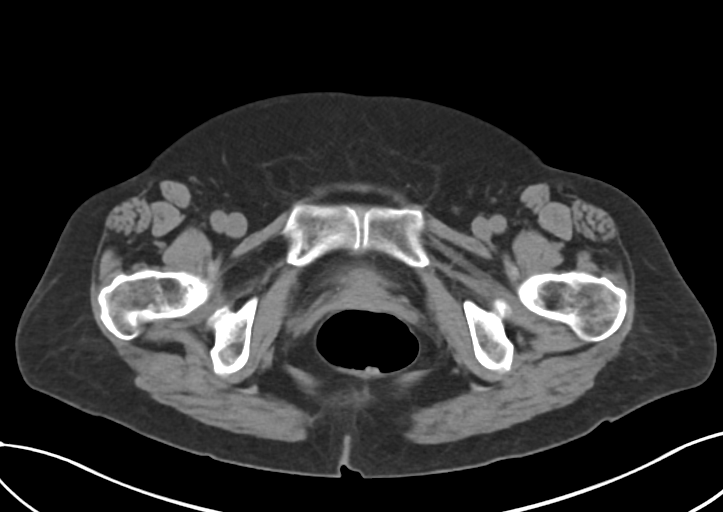
[im 19/48  soft-tissue]
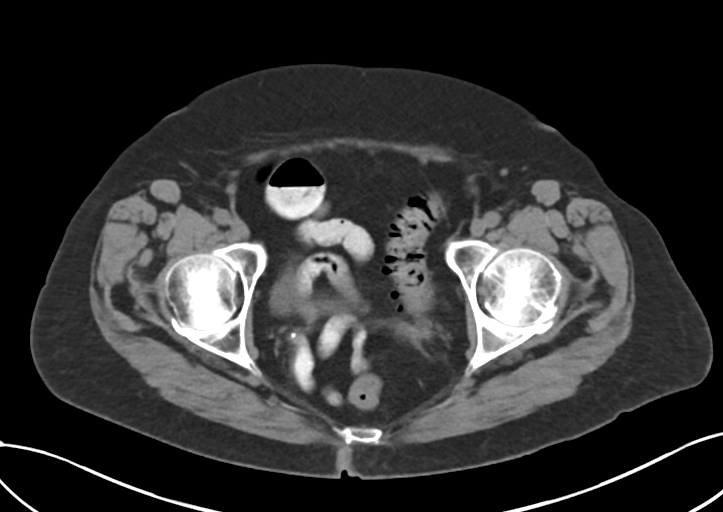
[im 25/48  soft-tissue]
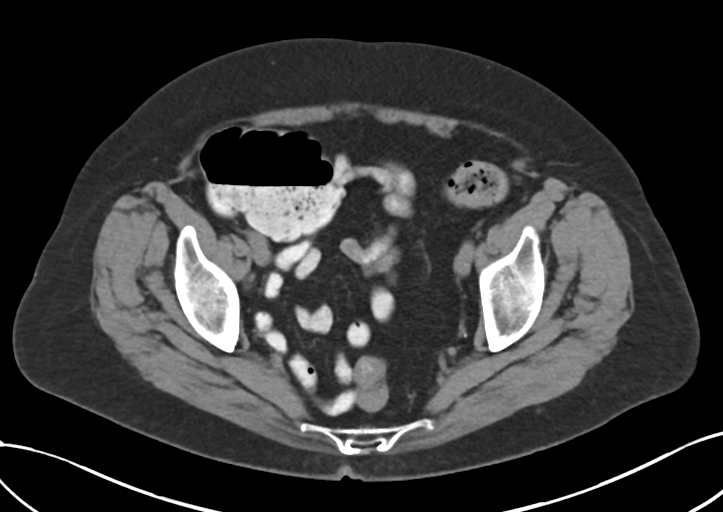
[im 29/48  soft-tissue]
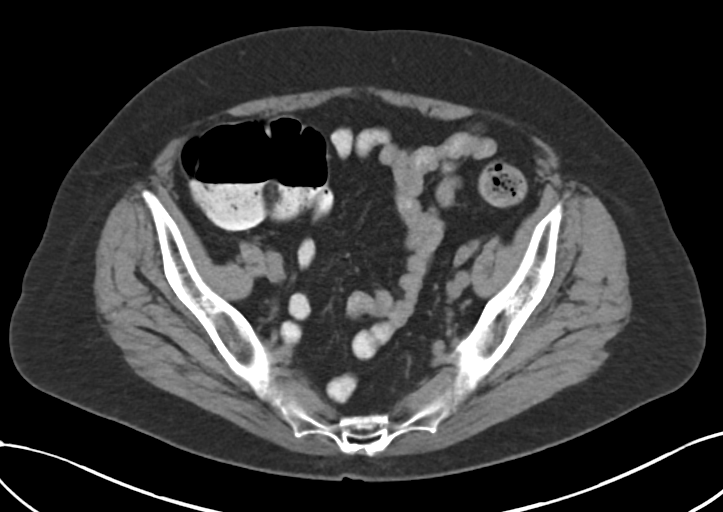
[im 34/48  soft-tissue]
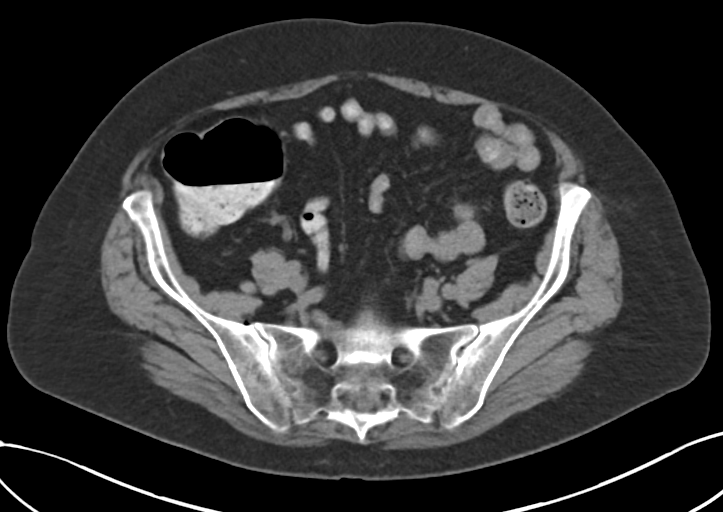
[im 40/48  soft-tissue]
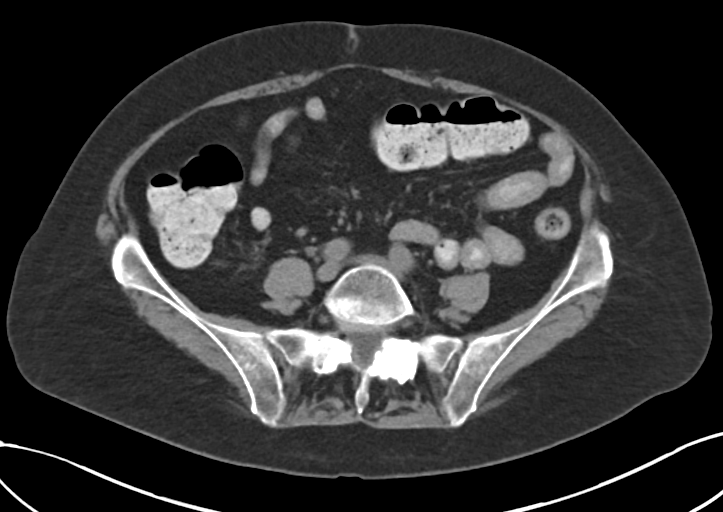
[im 44/48  soft-tissue]
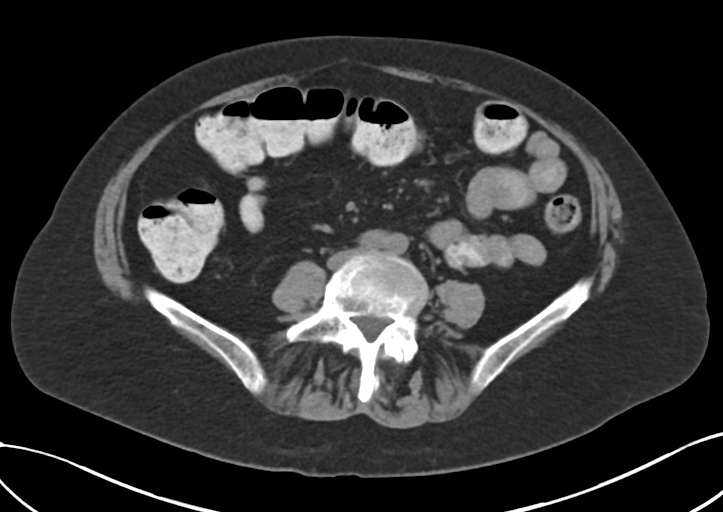
[im 44/48  bone]
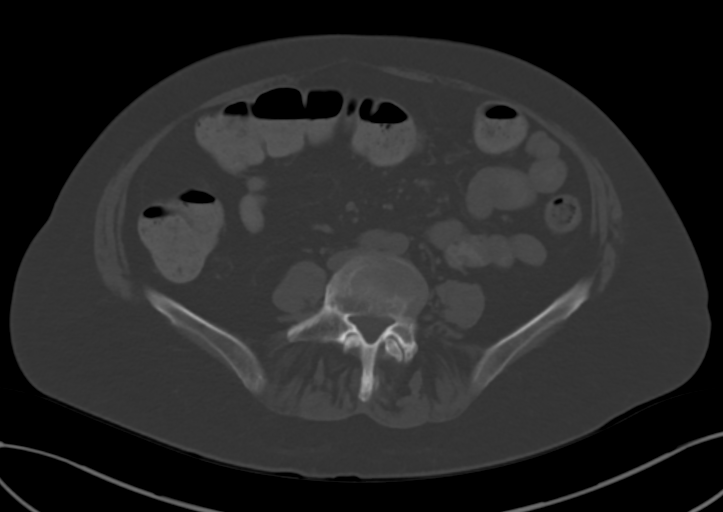

[Series 4: pelvis 2.00 br40 s3 · coronal · 0.47mm/px · 3 of 142 slices shown]
[im 48/142  soft-tissue]
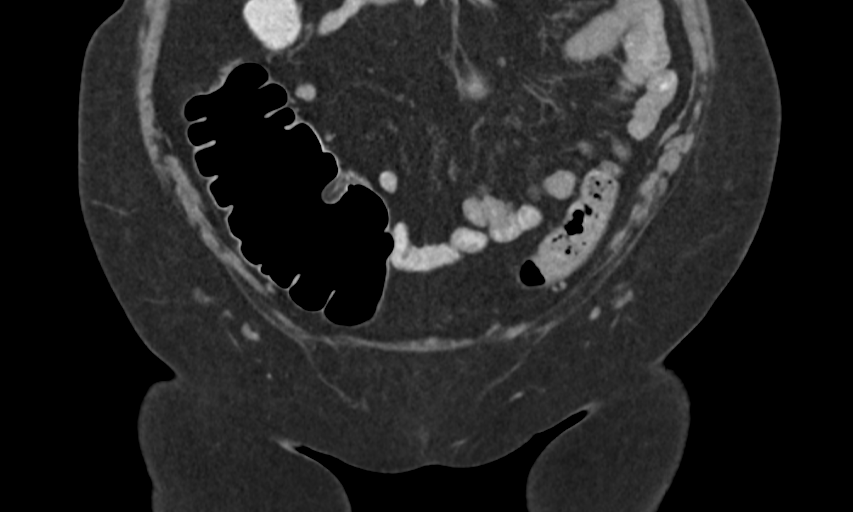
[im 63/142  soft-tissue]
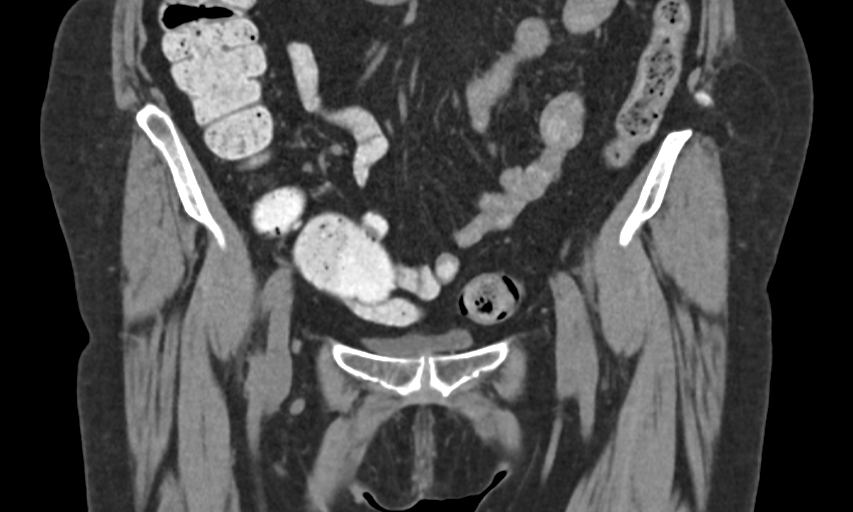
[im 79/142  soft-tissue]
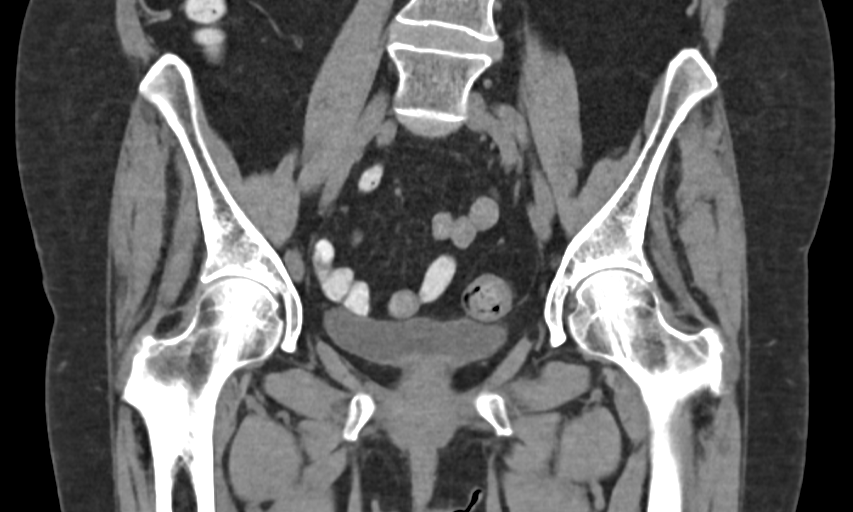

[12 of 46 positions shown; findings below may reference images not displayed]

FINDINGS: Urinary Tract: Distal ureters decompressed. Urinary bladder
incompletely distended.

Bowel: Visualized small bowel decompressed. Moderate fecal material
in proximal colon, decompressed distally.

Vascular/Lymphatic: No pathologically enlarged lymph nodes. No
significant vascular abnormality seen.

Reproductive:  Post hysterectomy.  No adnexal mass.

Other:  No ascites.  No free air.

Musculoskeletal: Small pelvic phleboliths. Left lateral abdominal
wall hernia anterior to the iliac wing, containing only
retroperitoneal fat.
IMPRESSION: 1. No acute findings.
2. Left lateral abdominal hernia containing only fat.

## 2023-01-20 DIAGNOSIS — D649 Anemia, unspecified: Secondary | ICD-10-CM | POA: Diagnosis not present

## 2023-01-28 DIAGNOSIS — D649 Anemia, unspecified: Secondary | ICD-10-CM | POA: Diagnosis not present

## 2023-02-03 DIAGNOSIS — D509 Iron deficiency anemia, unspecified: Secondary | ICD-10-CM | POA: Diagnosis not present

## 2023-02-03 DIAGNOSIS — R195 Other fecal abnormalities: Secondary | ICD-10-CM | POA: Diagnosis not present

## 2023-02-04 DIAGNOSIS — R21 Rash and other nonspecific skin eruption: Secondary | ICD-10-CM | POA: Diagnosis not present

## 2023-02-10 DIAGNOSIS — D509 Iron deficiency anemia, unspecified: Secondary | ICD-10-CM | POA: Diagnosis not present

## 2023-02-10 DIAGNOSIS — K3189 Other diseases of stomach and duodenum: Secondary | ICD-10-CM | POA: Diagnosis not present

## 2023-02-10 DIAGNOSIS — K2930 Chronic superficial gastritis without bleeding: Secondary | ICD-10-CM | POA: Diagnosis not present

## 2023-02-10 DIAGNOSIS — K219 Gastro-esophageal reflux disease without esophagitis: Secondary | ICD-10-CM | POA: Diagnosis not present

## 2023-02-10 DIAGNOSIS — R195 Other fecal abnormalities: Secondary | ICD-10-CM | POA: Diagnosis not present

## 2023-02-10 DIAGNOSIS — K2289 Other specified disease of esophagus: Secondary | ICD-10-CM | POA: Diagnosis not present

## 2023-02-10 DIAGNOSIS — K449 Diaphragmatic hernia without obstruction or gangrene: Secondary | ICD-10-CM | POA: Diagnosis not present

## 2023-02-12 DIAGNOSIS — K219 Gastro-esophageal reflux disease without esophagitis: Secondary | ICD-10-CM | POA: Diagnosis not present

## 2023-02-12 DIAGNOSIS — K2930 Chronic superficial gastritis without bleeding: Secondary | ICD-10-CM | POA: Diagnosis not present

## 2023-02-17 DIAGNOSIS — R350 Frequency of micturition: Secondary | ICD-10-CM | POA: Diagnosis not present

## 2023-02-17 DIAGNOSIS — Z6825 Body mass index (BMI) 25.0-25.9, adult: Secondary | ICD-10-CM | POA: Diagnosis not present

## 2023-03-02 DIAGNOSIS — D500 Iron deficiency anemia secondary to blood loss (chronic): Secondary | ICD-10-CM | POA: Diagnosis not present

## 2023-03-07 DIAGNOSIS — B349 Viral infection, unspecified: Secondary | ICD-10-CM | POA: Diagnosis not present

## 2023-03-13 DIAGNOSIS — M810 Age-related osteoporosis without current pathological fracture: Secondary | ICD-10-CM | POA: Diagnosis not present

## 2023-03-13 DIAGNOSIS — Z23 Encounter for immunization: Secondary | ICD-10-CM | POA: Diagnosis not present

## 2023-03-30 ENCOUNTER — Inpatient Hospital Stay: Payer: PPO

## 2023-03-30 ENCOUNTER — Encounter: Payer: Self-pay | Admitting: Nurse Practitioner

## 2023-03-30 ENCOUNTER — Inpatient Hospital Stay: Payer: PPO | Attending: Nurse Practitioner | Admitting: Nurse Practitioner

## 2023-03-30 VITALS — BP 148/68 | HR 78 | Temp 98.1°F | Resp 16 | Ht 61.0 in | Wt 138.7 lb

## 2023-03-30 DIAGNOSIS — M797 Fibromyalgia: Secondary | ICD-10-CM | POA: Diagnosis not present

## 2023-03-30 DIAGNOSIS — N390 Urinary tract infection, site not specified: Secondary | ICD-10-CM | POA: Insufficient documentation

## 2023-03-30 DIAGNOSIS — G2581 Restless legs syndrome: Secondary | ICD-10-CM | POA: Diagnosis not present

## 2023-03-30 DIAGNOSIS — Z90710 Acquired absence of both cervix and uterus: Secondary | ICD-10-CM | POA: Insufficient documentation

## 2023-03-30 DIAGNOSIS — Z880 Allergy status to penicillin: Secondary | ICD-10-CM | POA: Diagnosis not present

## 2023-03-30 DIAGNOSIS — K529 Noninfective gastroenteritis and colitis, unspecified: Secondary | ICD-10-CM | POA: Diagnosis not present

## 2023-03-30 DIAGNOSIS — G8929 Other chronic pain: Secondary | ICD-10-CM | POA: Insufficient documentation

## 2023-03-30 DIAGNOSIS — Z823 Family history of stroke: Secondary | ICD-10-CM | POA: Insufficient documentation

## 2023-03-30 DIAGNOSIS — Z79899 Other long term (current) drug therapy: Secondary | ICD-10-CM | POA: Diagnosis not present

## 2023-03-30 DIAGNOSIS — D509 Iron deficiency anemia, unspecified: Secondary | ICD-10-CM | POA: Insufficient documentation

## 2023-03-30 DIAGNOSIS — Z825 Family history of asthma and other chronic lower respiratory diseases: Secondary | ICD-10-CM | POA: Insufficient documentation

## 2023-03-30 DIAGNOSIS — I10 Essential (primary) hypertension: Secondary | ICD-10-CM | POA: Insufficient documentation

## 2023-03-30 DIAGNOSIS — Z87440 Personal history of urinary (tract) infections: Secondary | ICD-10-CM | POA: Diagnosis not present

## 2023-03-30 DIAGNOSIS — Z9049 Acquired absence of other specified parts of digestive tract: Secondary | ICD-10-CM | POA: Diagnosis not present

## 2023-03-30 DIAGNOSIS — M1990 Unspecified osteoarthritis, unspecified site: Secondary | ICD-10-CM | POA: Insufficient documentation

## 2023-03-30 DIAGNOSIS — D649 Anemia, unspecified: Secondary | ICD-10-CM

## 2023-03-30 DIAGNOSIS — F5089 Other specified eating disorder: Secondary | ICD-10-CM | POA: Insufficient documentation

## 2023-03-30 LAB — CBC WITH DIFFERENTIAL (CANCER CENTER ONLY)
Abs Immature Granulocytes: 0.01 10*3/uL (ref 0.00–0.07)
Basophils Absolute: 0 10*3/uL (ref 0.0–0.1)
Basophils Relative: 0 %
Eosinophils Absolute: 0 10*3/uL (ref 0.0–0.5)
Eosinophils Relative: 0 %
HCT: 42.3 % (ref 36.0–46.0)
Hemoglobin: 13.7 g/dL (ref 12.0–15.0)
Immature Granulocytes: 0 %
Lymphocytes Relative: 28 %
Lymphs Abs: 2.3 10*3/uL (ref 0.7–4.0)
MCH: 25.8 pg — ABNORMAL LOW (ref 26.0–34.0)
MCHC: 32.4 g/dL (ref 30.0–36.0)
MCV: 79.5 fL — ABNORMAL LOW (ref 80.0–100.0)
Monocytes Absolute: 0.7 10*3/uL (ref 0.1–1.0)
Monocytes Relative: 9 %
Neutro Abs: 5.1 10*3/uL (ref 1.7–7.7)
Neutrophils Relative %: 63 %
Platelet Count: 308 10*3/uL (ref 150–400)
RBC: 5.32 MIL/uL — ABNORMAL HIGH (ref 3.87–5.11)
RDW: 24 % — ABNORMAL HIGH (ref 11.5–15.5)
WBC Count: 8 10*3/uL (ref 4.0–10.5)
nRBC: 0 % (ref 0.0–0.2)

## 2023-03-30 LAB — FERRITIN: Ferritin: 34 ng/mL (ref 11–307)

## 2023-03-30 LAB — IRON AND IRON BINDING CAPACITY (CC-WL,HP ONLY)
Iron: 179 ug/dL — ABNORMAL HIGH (ref 28–170)
Saturation Ratios: 39 % — ABNORMAL HIGH (ref 10.4–31.8)
TIBC: 458 ug/dL — ABNORMAL HIGH (ref 250–450)
UIBC: 279 ug/dL (ref 148–442)

## 2023-03-30 LAB — RETIC PANEL
Immature Retic Fract: 7.3 % (ref 2.3–15.9)
RBC.: 5.3 MIL/uL — ABNORMAL HIGH (ref 3.87–5.11)
Retic Count, Absolute: 51.4 10*3/uL (ref 19.0–186.0)
Retic Ct Pct: 1 % (ref 0.4–3.1)
Reticulocyte Hemoglobin: 32.6 pg (ref >27.9)

## 2023-03-30 LAB — VITAMIN B12: Vitamin B-12: 4400 pg/mL — ABNORMAL HIGH (ref 180–914)

## 2023-03-30 LAB — FOLATE: Folate: 12.6 ng/mL (ref >5.9)

## 2023-03-30 NOTE — Progress Notes (Cosign Needed)
**Note De-Identified [place] [facility]** [facility]   [phone] [phone]   Clinic New consult Note   Patient Care Team: Sigmund Hazel, MD as PCP - General (Family Medicine) 03/30/2023  CHIEF COMPLAINTS/PURPOSE OF CONSULTATION:  Iron deficiency anemia, referred by GI Dr. Levora Angel  HISTORY OF PRESENTING ILLNESS:  Karen Dickerson 82 y.o. female with PMH including HTN arthritis, osteoporosis, fibromyalgia, restless leg is here because of iron deficiency anemia. CBC 08/23/21 was normal. Labs 11/04/2022 showed Hgb 10.8, MCV 74.1, normal platelet and WBC with ferritin 9.3.  No evidence of renal dysfunction.  Other workup showed positive FOBT x2 5/29 and 5/30.  Repeat CBC 12/15/2022 showed WBC 12.0, Hgb 10.4, MCV 72.6.  She was monitored closely with repeat CBC 01/20/2023 showing Hgb 10.0 and on 01/28/2023 Hgb down to 9.9. EGD/colonoscopy 02/10/2023 showed nonbleeding hemorrhoids, no evidence of bleeding. Capsule endoscopy 03/02/2023 showed no active bleeding.  She did not get the message to start oral iron and has only been on it a month or so, tolerating well.  She denies heavy NSAID use or anticoagulation.  She eats a regular non-vegetarian diet.  She has ice pica.  Denies history of gastric or bariatric surgeries.  She has recurrent UTIs but none recently.  She has not donated blood in years.  Had a hysterectomy for heavy periods about 40 years ago but did not require iron therapy in the past.  Socially, she is married with 2 daughters, 1 takes oral iron.  She is independent with ADLs.  Less active and going out recently due to chronic pain.  She is up-to-date on cancer screenings.  Denies alcohol, tobacco, or drug use.  Denies family history of cancer.  Today she presents with 1 daughter, tolerating oral iron, it has helped her chronic diarrhea to become more normal.  It has not helped her energy much. She has chronic pain from arthritis and fibromyalgia, had a knee replacement more than 2 years ago.  Denies  obvious bleeding.    MEDICAL HISTORY:  Past Medical History:  Diagnosis Date   Arthritis    Bursitis    Fibromyalgia    GERD (gastroesophageal reflux disease)    Hypertension    Osteoporosis    PONV (postoperative nausea and vomiting)    Restless leg syndrome     SURGICAL HISTORY: Past Surgical History:  Procedure Laterality Date   ABDOMINAL HYSTERECTOMY     APPENDECTOMY     CATARACT EXTRACTION  2013   CHOLECYSTECTOMY     COCCYX REMOVAL     COLONOSCOPY     EYE SURGERY     b/l   NECK SURGERY     fusion   SHOULDER ARTHROSCOPY     B/L   TONSILLECTOMY     TOTAL KNEE ARTHROPLASTY Right 11/20/2020   Procedure: RIGHT TOTAL KNEE ARTHROPLASTY;  Surgeon: Marcene Corning, MD;  Location: [facility];  Service: Orthopedics;  Laterality: Right;   TUBAL LIGATION      SOCIAL HISTORY: Social History   Socioeconomic History   Marital status: Married    Spouse name: Dimas Aguas   Number of children: 2   Years of education: 12th   Highest education level: Not on file  Occupational History   Occupation: Retired  Tobacco Use   Smoking status: Never   Smokeless tobacco: Never  Vaping Use   Vaping status: Never Used  Substance and Sexual Activity   Alcohol use: No   Drug use: No   Sexual activity: Not Currently  Other Topics Concern **Note De-Identified [place] [facility]** Not on file  Social History Narrative   Patient lives at home with spouse.   Caffeine Use: none   Social Determinants of Corporate investment banker Strain: Not on file  Food Insecurity: Not on file  Transportation Needs: Not on file  Physical Activity: Not on file  Stress: Not on file  Social Connections: Not on file  Intimate Partner Violence: Not on file    FAMILY HISTORY: Family History  Problem Relation Age of Onset   Stroke Mother    Lung disease Father     ALLERGIES:  is allergic to fish oil and penicillins.  MEDICATIONS:  Current Outpatient Medications  Medication Sig Dispense Refill   acetaminophen (TYLENOL) 500 MG tablet  Take 500 mg by mouth every 8 (eight) hours as needed for moderate pain.     amLODipine (NORVASC) 10 MG tablet Take 10 mg by mouth daily.     omeprazole (PRILOSEC) 20 MG capsule Take 20 mg by mouth daily.     pramipexole (MIRAPEX) 0.125 MG tablet Take 0.125 mg by mouth 2 (two) times daily.     tiZANidine (ZANAFLEX) 2 MG tablet Take 2 mg by mouth every 6 (six) hours as needed for muscle spasms.     traMADol (ULTRAM) 50 MG tablet Take 50 mg by mouth in the morning and at bedtime.     triamterene-hydrochlorothiazide (MAXZIDE-25) 37.5-25 MG tablet Take 0.5 tablets by mouth every morning.     trimethoprim (TRIMPEX) 100 MG tablet Take 100 mg by mouth See admin instructions. Take twice daily for 3 days as needed     No current facility-administered medications for this visit.    REVIEW OF SYSTEMS:   Constitutional: Denies fevers, chills, abnormal night sweats, or unintentional weight loss (+) fatigue Eyes: Denies blurriness of vision, double vision or watery eyes Ears, nose, mouth, throat, and face: Denies mucositis or sore throat Respiratory: Denies cough, dyspnea or wheezes Cardiovascular: Denies palpitation, chest discomfort or lower extremity swelling Gastrointestinal:  Denies nausea, vomiting, hematochezia/melena, pain, heartburn or change in bowel habits (+) Chronic diarrhea Skin: Denies abnormal skin rashes Lymphatics: Denies new lymphadenopathy or easy bruising Neurological:Denies numbness, tingling or new weaknesses Behavioral/Psych: Mood is stable, no new changes  All other systems were reviewed with the patient and are negative.  PHYSICAL EXAMINATION: ECOG PERFORMANCE STATUS: 1 - Symptomatic but completely ambulatory  Vitals:   03/30/23 1223 03/30/23 1229  BP: (!) 150/79 (!) 148/68  Pulse: 78   Resp: 16   Temp: 98.1 F (36.7 C)   SpO2: 98%    Filed Weights   03/30/23 1223  Weight: 138 lb 11.2 oz (62.9 kg)    GENERAL:alert, no distress and comfortable SKIN: skin color,  texture, turgor are normal, no rashes or significant lesions EYES:  sclera clear NECK: without mass LYMPH:  no palpable cervical or supraclavicular lymphadenopathy LUNGS: clear with normal breathing effort HEART: regular rate & rhythm, no lower extremity edema ABDOMEN:abdomen soft, non-tender and normal bowel sounds Musculoskeletal:no cyanosis of digits and no clubbing  PSYCH: alert & oriented x 3 with fluent speech NEURO: no focal motor/sensory deficits  LABORATORY DATA:  I have reviewed the data as listed    Latest Ref Rng & Units 03/30/2023    1:47 PM 08/23/2021    5:29 PM 11/12/2020    8:24 AM  CBC  WBC 4.0 - 10.5 K/uL 8.0  10.1  10.3   Hemoglobin 12.0 - 15.0 g/dL 16.1  09.6  04.5   Hematocrit 36.0 - **Note De-Identified [place] [facility]** 46.0 % 42.3  39.9  44.6   Platelets 150 - 400 K/uL 308  290  317        Latest Ref Rng & Units 08/23/2021    6:00 PM 08/23/2021    5:29 PM 11/12/2020    8:24 AM  CMP  Glucose 70 - 99 mg/dL  147  99   BUN 8 - 23 mg/dL  20  18   Creatinine 8.29 - 1.00 mg/dL  5.62  1.30   Sodium 865 - 145 mmol/L  136  136   Potassium 3.5 - 5.1 mmol/L  3.5  3.6   Chloride 98 - 111 mmol/L  101  101   CO2 22 - 32 mmol/L  26  30   Calcium 8.9 - 10.3 mg/dL  9.6  78.4   Total Protein 6.5 - 8.1 g/dL 7.0     Total Bilirubin 0.3 - 1.2 mg/dL 0.7     Alkaline Phos 38 - 126 U/L 50     AST 15 - 41 U/L 21     ALT 0 - 44 U/L 19        RADIOGRAPHIC STUDIES: I have personally reviewed the radiological images as listed and agreed with the findings in the report. No results found.  ASSESSMENT & PLAN: 82 year old female  Iron deficiency anemia -We reviewed her medical record in detail with the patient and her family.  She had normal CBC until she developed mild iron deficiency anemia in 10/2022, Hgb 9-10 range and ferritin of 9 -EGD/colonoscopy and capsule endoscopy by Dr. Levora Angel showed chronic inactive gastritis and GERD changes, no source of bleeding or malignancy -We discussed she may have microscopic  bleeding not captured on the endoscopies, vs low dietary iron vs low absorption -She is symptomatic with fatigue and ice pica -Taking oral iron for about a month, tolerating well  -Currently takes PPI with iron and other routine meds, I encouraged her to separate oral iron from PPI and take with vitamin C to enhance absorption -Will update iron studies today to see if she is responding to oral iron, as well as check folate, B12, celiac panel, and retic -If she is not responding to oral iron, or develops intolerance we discussed indications for IV iron, and the possible risk, benefits, and side effects -Briefly discussed if anemia does not resolve with adequate iron replacement, she may require further anemia workup  -F/up pending today's results, with tentative plan to check labs q3 months and f/up in 6 months  OA, chronic pain, fibromyalgia, recurrent UTI -Per PCP   PLAN: -Medical record, labs, endoscopies, and path reviewed -Continue oral iron, separate from PPI and take with vitamin C to enhance absorption -Labs today, will call with results  -Tentative labs q3 months and follow-up in 6 months, or sooner if needed -Patient seen with Dr. Mosetta Putt   Orders Placed This Encounter  Procedures   CBC with Differential (Cancer Center Only)    Standing Status:   Future    Number of Occurrences:   1    Standing Expiration Date:   03/29/2024   Ferritin    Standing Status:   Future    Number of Occurrences:   1    Standing Expiration Date:   03/29/2024   Iron and Iron Binding Capacity ([facility] only)    Standing Status:   Future    Number of Occurrences:   1    Standing Expiration Date:   03/29/2024   Celiac panel 10 **Note De-Identified [place] [facility]** Standing Status:   Future    Number of Occurrences:   1    Standing Expiration Date:   03/29/2024   Retic Panel    Standing Status:   Future    Number of Occurrences:   1    Standing Expiration Date:   03/29/2024   Folate, Serum    Standing Status:   Future    Number of  Occurrences:   1    Standing Expiration Date:   03/29/2024   Vitamin B12    Standing Status:   Future    Number of Occurrences:   1    Standing Expiration Date:   03/29/2024   Methylmalonic acid, serum    Standing Status:   Future    Number of Occurrences:   1    Standing Expiration Date:   03/29/2024     All questions were answered. The patient knows to call the clinic with any problems, questions or concerns.      Pollyann Samples, NP 03/30/23   Addendum I have seen the patient, examined her. I agree with the assessment and and plan and have edited the notes.   Patient is a 82 year old female with newly onset iron deficient anemia, she has not had GI workup which were negative.  She started oral iron 1 months ago, plan to repeat CBC and iron study today and monitor in futre.  We discussed option of IV iron if oral iron is not adequate.  She agrees with the plan, all questions were answered.  I spent a total of 30 minutes for her visit today, more than 50% time on face-to-face counseling.  Malachy Mood  03/30/2023

## 2023-04-01 LAB — CELIAC PANEL 10
Antigliadin Abs, IgA: 7 U (ref 0–19)
Endomysial Ab, IgA: NEGATIVE
Gliadin IgG: 3 U (ref 0–19)
IgA: 407 mg/dL (ref 64–422)
Tissue Transglut Ab: 3 U/mL (ref 0–5)
Tissue Transglutaminase Ab, IgA: <2 U/mL (ref 0–3)

## 2023-04-03 LAB — METHYLMALONIC ACID, SERUM: Methylmalonic Acid, Quantitative: 291 nmol/L (ref 0–378)

## 2023-04-06 ENCOUNTER — Telehealth: Payer: Self-pay

## 2023-04-06 ENCOUNTER — Other Ambulatory Visit: Payer: Self-pay | Admitting: Nurse Practitioner

## 2023-04-06 DIAGNOSIS — D649 Anemia, unspecified: Secondary | ICD-10-CM

## 2023-04-06 NOTE — Telephone Encounter (Addendum)
**Note De-Identified [place] [facility]** Called patient and relayed message below as per Santiago Glad NP. Patient voiced full understanding.   ----- Message from Pollyann Samples sent at 04/06/2023 10:55 AM EDT ----- Please let Ms. Laduca know I reviewed her labs. She is not anemic, normal ferritin, and elevated B12. She can stop oral B12 for now. Please complete 2 full months of oral iron then she can stop. She does not need IV iron at this time. Keep lab visit 06/29/23 as scheduled.   Thanks, Clayborn Heron NP

## 2023-04-16 DIAGNOSIS — L304 Erythema intertrigo: Secondary | ICD-10-CM | POA: Diagnosis not present

## 2023-04-28 DIAGNOSIS — Z1231 Encounter for screening mammogram for malignant neoplasm of breast: Secondary | ICD-10-CM | POA: Diagnosis not present

## 2023-05-04 DIAGNOSIS — M810 Age-related osteoporosis without current pathological fracture: Secondary | ICD-10-CM | POA: Diagnosis not present

## 2023-05-04 DIAGNOSIS — M1990 Unspecified osteoarthritis, unspecified site: Secondary | ICD-10-CM | POA: Diagnosis not present

## 2023-05-04 DIAGNOSIS — D508 Other iron deficiency anemias: Secondary | ICD-10-CM | POA: Diagnosis not present

## 2023-05-04 DIAGNOSIS — Z9989 Dependence on other enabling machines and devices: Secondary | ICD-10-CM | POA: Diagnosis not present

## 2023-05-04 DIAGNOSIS — R7303 Prediabetes: Secondary | ICD-10-CM | POA: Diagnosis not present

## 2023-05-04 DIAGNOSIS — G2581 Restless legs syndrome: Secondary | ICD-10-CM | POA: Diagnosis not present

## 2023-05-04 DIAGNOSIS — I10 Essential (primary) hypertension: Secondary | ICD-10-CM | POA: Diagnosis not present

## 2023-05-04 DIAGNOSIS — Z6826 Body mass index (BMI) 26.0-26.9, adult: Secondary | ICD-10-CM | POA: Diagnosis not present

## 2023-06-08 DIAGNOSIS — Z0000 Encounter for general adult medical examination without abnormal findings: Secondary | ICD-10-CM | POA: Diagnosis not present

## 2023-06-29 ENCOUNTER — Other Ambulatory Visit: Payer: PPO

## 2023-09-11 DIAGNOSIS — M810 Age-related osteoporosis without current pathological fracture: Secondary | ICD-10-CM | POA: Diagnosis not present

## 2023-09-28 ENCOUNTER — Inpatient Hospital Stay: Payer: PPO | Admitting: Nurse Practitioner

## 2023-09-28 ENCOUNTER — Telehealth: Payer: Self-pay

## 2023-09-28 ENCOUNTER — Inpatient Hospital Stay: Payer: PPO

## 2023-09-28 NOTE — Progress Notes (Deleted)
**Note De-Identified [place] [facility]bfuscation** Patient Care Team: Sigmund Hazel, MD as PCP - General (Family Medicine)   CHIEF COMPLAINT: Follow up IDA  CURRENT THERAPY: Oral iron with vit C source  INTERVAL HISTORY Ms. Conrad returns for follow up, seen initially as new patient 03/30/23. Labs that day showed no anemia, normal iron panel. She continues oral iron.   ROS   Past Medical History:  Diagnosis Date   Arthritis    Bursitis    Fibromyalgia    GERD (gastroesophageal reflux disease)    Hypertension    Osteoporosis    PONV (postoperative nausea and vomiting)    Restless leg syndrome      Past Surgical History:  Procedure Laterality Date   ABDOMINAL HYSTERECTOMY     APPENDECTOMY     CATARACT EXTRACTION  2013   CHOLECYSTECTOMY     COCCYX REMOVAL     COLONOSCOPY     EYE SURGERY     b/l   NECK SURGERY     fusion   SHOULDER ARTHROSCOPY     B/L   TONSILLECTOMY     TOTAL KNEE ARTHROPLASTY Right 11/20/2020   Procedure: RIGHT TOTAL KNEE ARTHROPLASTY;  Surgeon: Marcene Corning, MD;  Location: [facility];  Service: Orthopedics;  Laterality: Right;   TUBAL LIGATION       Outpatient Encounter Medications as of 09/28/2023  Medication Sig Note   acetaminophen (TYLENOL) 500 MG tablet Take 500 mg by mouth every 8 (eight) hours as needed for moderate pain.    amLODipine (NORVASC) 10 MG tablet Take 10 mg by mouth daily.    omeprazole (PRILOSEC) 20 MG capsule Take 20 mg by mouth daily. 11/01/2020: OTC   pramipexole (MIRAPEX) 0.125 MG tablet Take 0.125 mg by mouth 2 (two) times daily.    tiZANidine (ZANAFLEX) 2 MG tablet Take 2 mg by mouth every 6 (six) hours as needed for muscle spasms.    traMADol (ULTRAM) 50 MG tablet Take 50 mg by mouth in the morning and at bedtime.    triamterene-hydrochlorothiazide (MAXZIDE-25) 37.5-25 MG tablet Take 0.5 tablets by mouth every morning.    trimethoprim (TRIMPEX) 100 MG tablet Take 100 mg by mouth See admin instructions. Take twice daily for 3 days as needed    No facility-administered  encounter medications on file as of 09/28/2023.     There were no vitals filed for this visit. There is no height or weight on file to calculate BMI.   PHYSICAL EXAM GENERAL:alert, no distress and comfortable SKIN: no rash  EYES: sclera clear NECK: without mass LYMPH:  no palpable cervical or supraclavicular lymphadenopathy  LUNGS: clear with normal breathing effort HEART: regular rate & rhythm, no lower extremity edema ABDOMEN: abdomen soft, non-tender and normal bowel sounds NEURO: alert & oriented x 3 with fluent speech, no focal motor/sensory deficits Breast exam:  PAC without erythema    CBC    Component Value Date/Time   WBC 8.0 03/30/2023 1347   WBC 10.1 08/23/2021 1729   RBC 5.30 (H) 03/30/2023 1347   RBC 5.32 (H) 03/30/2023 1347   HGB 13.7 03/30/2023 1347   HCT 42.3 03/30/2023 1347   PLT 308 03/30/2023 1347   MCV 79.5 (L) 03/30/2023 1347   MCH 25.8 (L) 03/30/2023 1347   MCHC 32.4 03/30/2023 1347   RDW 24.0 (H) 03/30/2023 1347   LYMPHSABS 2.3 03/30/2023 1347   MONOABS 0.7 03/30/2023 1347   EOSABS 0.0 03/30/2023 1347   BASOSABS 0.0 03/30/2023 1347     CMP **Note De-Identified [place] [facility]bfuscation** Component Value Date/Time   NA 136 08/23/2021 1729   K 3.5 08/23/2021 1729   CL 101 08/23/2021 1729   CO2 26 08/23/2021 1729   GLUCOSE 184 (H) 08/23/2021 1729   BUN 20 08/23/2021 1729   CREATININE 1.16 (H) 08/23/2021 1729   CALCIUM 9.6 08/23/2021 1729   PROT 7.0 08/23/2021 1800   ALBUMIN 3.4 (L) 08/23/2021 1800   AST 21 08/23/2021 1800   ALT 19 08/23/2021 1800   ALKPHOS 50 08/23/2021 1800   BILITOT 0.7 08/23/2021 1800   GFRNONAA 48 (L) 08/23/2021 1729     ASSESSMENT & PLAN: 83 year old female   Iron deficiency anemia -We reviewed her medical record in detail with the patient and her family.  She had normal CBC until she developed mild iron deficiency anemia in 10/2022, Hgb 9-10 range and ferritin of 9 -EGD/colonoscopy and capsule endoscopy by Dr. Levora Angel showed chronic inactive gastritis and  GERD changes, no source of bleeding or malignancy -We discussed she may have microscopic bleeding not captured on the endoscopies, vs low dietary iron vs low absorption -She is symptomatic with fatigue and ice pica -Taking oral iron for about a month, tolerating well  -Currently takes PPI with iron and other routine meds, I encouraged her to separate oral iron from PPI and take with vitamin C to enhance absorption -Will update iron studies today to see if she is responding to oral iron, as well as check folate, B12, celiac panel, and retic -If she is not responding to oral iron, or develops intolerance we discussed indications for IV iron, and the possible risk, benefits, and side effects -Briefly discussed if anemia does not resolve with adequate iron replacement, she may require further anemia workup  -F/up pending today's results, with tentative plan to check labs q3 months and f/up in 6 months   OA, chronic pain, fibromyalgia, recurrent UTI -Per PCP  PLAN:  No orders of the defined types were placed in this encounter.     All questions were answered. The patient knows to call the clinic with any problems, questions or concerns. No barriers to learning were detected. I spent *** counseling the patient face to face. The total time spent in the appointment was *** and more than 50% was on counseling, review of test results, and coordination of care.   Santiago Glad, NP-C @DATE @

## 2023-09-28 NOTE — Telephone Encounter (Signed)
**Note De-Identified [place] [facility]** spoke with pt regarding her No Show for today. She did not want to reschedule today. She will be managed by her PCP Dr Sigmund Hazel

## 2023-11-05 DIAGNOSIS — Z6824 Body mass index (BMI) 24.0-24.9, adult: Secondary | ICD-10-CM | POA: Diagnosis not present

## 2023-11-05 DIAGNOSIS — K529 Noninfective gastroenteritis and colitis, unspecified: Secondary | ICD-10-CM | POA: Diagnosis not present

## 2023-11-05 DIAGNOSIS — I10 Essential (primary) hypertension: Secondary | ICD-10-CM | POA: Diagnosis not present

## 2023-11-05 DIAGNOSIS — Z9989 Dependence on other enabling machines and devices: Secondary | ICD-10-CM | POA: Diagnosis not present

## 2023-11-05 DIAGNOSIS — G2581 Restless legs syndrome: Secondary | ICD-10-CM | POA: Diagnosis not present

## 2023-11-05 DIAGNOSIS — M1990 Unspecified osteoarthritis, unspecified site: Secondary | ICD-10-CM | POA: Diagnosis not present

## 2023-11-05 DIAGNOSIS — M810 Age-related osteoporosis without current pathological fracture: Secondary | ICD-10-CM | POA: Diagnosis not present

## 2023-11-07 DIAGNOSIS — M1712 Unilateral primary osteoarthritis, left knee: Secondary | ICD-10-CM | POA: Diagnosis not present

## 2023-12-21 DIAGNOSIS — M533 Sacrococcygeal disorders, not elsewhere classified: Secondary | ICD-10-CM | POA: Diagnosis not present

## 2023-12-21 DIAGNOSIS — M810 Age-related osteoporosis without current pathological fracture: Secondary | ICD-10-CM | POA: Diagnosis not present

## 2023-12-21 DIAGNOSIS — Z6823 Body mass index (BMI) 23.0-23.9, adult: Secondary | ICD-10-CM | POA: Diagnosis not present

## 2024-01-13 ENCOUNTER — Encounter: Payer: Self-pay | Admitting: Physical Therapy

## 2024-01-13 ENCOUNTER — Other Ambulatory Visit: Payer: Self-pay

## 2024-01-13 ENCOUNTER — Ambulatory Visit: Attending: Family Medicine | Admitting: Physical Therapy

## 2024-01-13 DIAGNOSIS — M6281 Muscle weakness (generalized): Secondary | ICD-10-CM | POA: Diagnosis not present

## 2024-01-13 DIAGNOSIS — M5459 Other low back pain: Secondary | ICD-10-CM | POA: Diagnosis not present

## 2024-01-13 DIAGNOSIS — M25552 Pain in left hip: Secondary | ICD-10-CM | POA: Insufficient documentation

## 2024-01-13 DIAGNOSIS — M25652 Stiffness of left hip, not elsewhere classified: Secondary | ICD-10-CM | POA: Insufficient documentation

## 2024-01-13 NOTE — Therapy (Signed)
**Note De-Identified [place] [facility]** OUTPATIENT PHYSICAL THERAPY THORACOLUMBAR EVALUATION   Patient Name: Karen Dickerson MRN: [id number] DOB:Feb 22, 1941, 83 y.o., female Today's Date: 01/13/2024  END OF SESSION:  PT End of Session - 01/13/24 0757     Visit Number 1    Date for PT Re-Evaluation 04/14/24    Authorization Type HTA    PT Start Time 0757    PT Stop Time 0845    PT Time Calculation (min) 48 min    Activity Tolerance Patient tolerated treatment well    Behavior During Therapy WFL for tasks assessed/performed          Past Medical History:  Diagnosis Date   Arthritis    Bursitis    Fibromyalgia    GERD (gastroesophageal reflux disease)    Hypertension    Osteoporosis    PONV (postoperative nausea and vomiting)    Restless leg syndrome    Past Surgical History:  Procedure Laterality Date   ABDOMINAL HYSTERECTOMY     APPENDECTOMY     CATARACT EXTRACTION  2013   CHOLECYSTECTOMY     COCCYX REMOVAL     COLONOSCOPY     EYE SURGERY     b/l   NECK SURGERY     fusion   SHOULDER ARTHROSCOPY     B/L   TONSILLECTOMY     TOTAL KNEE ARTHROPLASTY Right 11/20/2020   Procedure: RIGHT TOTAL KNEE ARTHROPLASTY;  Surgeon: Sheril Coy, MD;  Location: [facility];  Service: Orthopedics;  Laterality: Right;   TUBAL LIGATION     Patient Active Problem List   Diagnosis Date Noted   Primary osteoarthritis of right knee 11/20/2020   Restless legs syndrome (RLS) 01/03/2013    PCP: Cleotilde, MD  REFERRING PROVIDER: Aisha, MD  REFERRING DIAG: LBP  Rationale for Evaluation and Treatment: Rehabilitation  THERAPY DIAG:  Other low back pain  Muscle weakness (generalized)  ONSET DATE: 12/22/23  SUBJECTIVE:                                                                                                                                                                                           SUBJECTIVE STATEMENT: PAtient reports that she has been having pain for about a month, unsure of a cause, reports just  really did not get better, MD feels like it is the SI joint.  Reports difficulty walking, sleeping  PERTINENT HISTORY:  TSA, TKA, ACDF  PAIN:  Are you having pain? Yes: NPRS scale: 10/10 Pain location: left SI area Pain description: ache Aggravating factors: difficulty with everything, everything hurts pain up to 10./10 Relieving factors: rest, meds at best pain a 7/10  PRECAUTIONS: **Note De-Identified [place] Obfuscation** None  RED FLAGS: None   WEIGHT BEARING RESTRICTIONS: No  FALLS:  Has patient fallen in last 6 months? No  LIVING ENVIRONMENT: Lives with: lives with their family Lives in: House/apartment Stairs: No Has following equipment at home: Single point cane  OCCUPATION: retired Community education officer  PLOF: Independent and did all housework, liked to do gardening  PATIENT GOALS: have less pain, be able to do things  NEXT MD VISIT: none scheduled  OBJECTIVE:  Note: Objective measures were completed at Evaluation unless otherwise noted.  DIAGNOSTIC FINDINGS:  X-rays were negative  PATIENT SURVEYS:  ODI 92%  COGNITION: Overall cognitive status: Within functional limits for tasks assessed     SENSATION: WFL  MUSCLE LENGTH: Very tight all over, unable to tolerate much due to pain  POSTURE: rounded shoulders, forward head, and decreased lumbar lordosis  PALPATION: Significant tenderness in the left   LUMBAR ROM: all motions cause pain  AROM eval  Flexion Decreased 75%  Extension Decreased 75%  Right lateral flexion Decreased 75%  Left lateral flexion Decreased 75%  Right rotation   Left rotation    (Blank rows = not tested)  LOWER EXTREMITY ROM:    WFL's with pain   LOWER EXTREMITY MMT:  all about 4-/5 with pain   LUMBAR SPECIAL TESTS:  Straight leg raise test: Positive, Slump test: Positive, and Positive shotgun test, other tests were deferred due to pain  FUNCTIONAL TESTS:  5 times sit to stand: 40 seconds very slow and guarded Timed up and go (TUG): 29 seconds very  unsteady  GAIT: Distance walked: 50 feet Assistive device utilized: Single point cane Level of assistance: Complete Independence Comments: very slow, very guarded, unsteady, tends to hold onto walls  TREATMENT DATE:  01/13/24 Evaluation, STM with the T-gun and then MHP/IFC to the left low back in sitting                                                                                                                              PATIENT EDUCATION:  Education details: POC/HEP Person educated: Patient Education method: Programmer, multimedia, Demonstration, Verbal cues, and Handouts Education comprehension: verbalized understanding  HOME EXERCISE PROGRAM: Access Code: [email or web address] URL: [email or web address] Date: 01/13/2024 Prepared by: Ozell Mainland  Exercises - Supine Hip Adduction Isometric with Ball  - 2 x daily - 7 x weekly - 1 sets - 10 reps - 3 hold - Hooklying Abduction with Resistance  - 2 x daily - 7 x weekly - 1 sets - 10 reps - 3 hold - Hooklying Single Knee to Chest  - 2 x daily - 7 x weekly - 1 sets - 10 reps - 5 hold - Seated Pelvic Tilt  - 2 x daily - 7 x weekly - 1 sets - 10 reps - 3 hold  ASSESSMENT:  CLINICAL IMPRESSION: Patient is a 83 y.o. female who was seen today for physical therapy evaluation and treatment for left low back pain, MD feels **Note De-Identified [place] [facility]bfuscation** it is SI related, she is very guarded and does not move well, all motions increase pain as well as all tests, she did have a significant pop with shotgun test while I was testing MMT hip adduction.  Very tender to the left SI area, mms are guarded   OBJECTIVE IMPAIRMENTS: Abnormal gait, cardiopulmonary status limiting activity, decreased activity tolerance, decreased balance, decreased coordination, decreased endurance, decreased mobility, difficulty walking, decreased ROM, decreased strength, increased muscle spasms, impaired flexibility, improper body mechanics, postural dysfunction, and pain.   ACTIVITY LIMITATIONS:  carrying, lifting, bending, sitting, standing, squatting, sleeping, stairs, and locomotion level  PARTICIPATION LIMITATIONS: meal prep, cleaning, laundry, driving, shopping, and community activity  PERSONAL FACTORS: Age, Fitness, Past/current experiences, and 1-2 comorbidities: TKA, TSA are also affecting patient's functional outcome.   REHAB POTENTIAL: Good  CLINICAL DECISION MAKING: Evolving/moderate complexity  EVALUATION COMPLEXITY: Moderate   GOALS: Goals reviewed with patient? Yes  SHORT TERM GOALS: Target date: 02/13/24  Independent with initial HEP Baseline: Goal status: INITIAL  LONG TERM GOALS: Target date: 04/14/24  Independent with advanced HEP Baseline:  Goal status: INITIAL  2.  Understand posture and body mechanics  Baseline:  Goal status: INITIAL  3.  Report pain decreased 50% Baseline:  Goal status: INITIAL  4.  Decrease TUG time to 19 seconds Baseline:  Goal status: INITIAL  5.  Increase lumbar ROM 25% Baseline:  Goal status: INITIAL  6.  Be able to cook a meal Baseline: has not been able to stand and cook in the past 3 weeks Goal status: INITIAL  PLAN:  PT FREQUENCY: 1-2x/week  PT DURATION: 12 weeks  PLANNED INTERVENTIONS: 97164- PT Re-evaluation, 97110-Therapeutic exercises, 97530- Therapeutic activity, 97112- Neuromuscular re-education, 97535- Self Care, 02859- Manual therapy, [id number]- Electrical stimulation (unattended), 97035- Ultrasound, 02987- Traction (mechanical), Patient/Family education, Balance training, Stair training, Taping, Joint mobilization, Cryotherapy, and Moist heat.  PLAN FOR NEXT SESSION: slowly move, modalities as needed   OBADIAH OZELL ORN, PT 01/13/2024, 7:58 AM

## 2024-01-21 ENCOUNTER — Ambulatory Visit: Admitting: Physical Therapy

## 2024-01-21 ENCOUNTER — Encounter: Payer: Self-pay | Admitting: Physical Therapy

## 2024-01-21 DIAGNOSIS — M5459 Other low back pain: Secondary | ICD-10-CM | POA: Diagnosis not present

## 2024-01-21 DIAGNOSIS — M6281 Muscle weakness (generalized): Secondary | ICD-10-CM

## 2024-01-21 DIAGNOSIS — M25552 Pain in left hip: Secondary | ICD-10-CM

## 2024-01-21 DIAGNOSIS — M25652 Stiffness of left hip, not elsewhere classified: Secondary | ICD-10-CM

## 2024-01-21 NOTE — Therapy (Signed)
**Note De-Identified [place] [facility]** OUTPATIENT PHYSICAL THERAPY THORACOLUMBAR EVALUATION   Patient Name: Karen Dickerson MRN: [id number] DOB:08-10-1940, 83 y.o., female Today's Date: 01/21/2024  END OF SESSION:  PT End of Session - 01/21/24 1652     Visit Number 2    Date for PT Re-Evaluation 04/14/24    Authorization Type HTA    PT Start Time 1650    PT Stop Time 1743    PT Time Calculation (min) 53 min    Activity Tolerance Patient tolerated treatment well    Behavior During Therapy WFL for tasks assessed/performed          Past Medical History:  Diagnosis Date   Arthritis    Bursitis    Fibromyalgia    GERD (gastroesophageal reflux disease)    Hypertension    Osteoporosis    PONV (postoperative nausea and vomiting)    Restless leg syndrome    Past Surgical History:  Procedure Laterality Date   ABDOMINAL HYSTERECTOMY     APPENDECTOMY     CATARACT EXTRACTION  2013   CHOLECYSTECTOMY     COCCYX REMOVAL     COLONOSCOPY     EYE SURGERY     b/l   NECK SURGERY     fusion   SHOULDER ARTHROSCOPY     B/L   TONSILLECTOMY     TOTAL KNEE ARTHROPLASTY Right 11/20/2020   Procedure: RIGHT TOTAL KNEE ARTHROPLASTY;  Surgeon: Sheril Coy, MD;  Location: [facility];  Service: Orthopedics;  Laterality: Right;   TUBAL LIGATION     Patient Active Problem List   Diagnosis Date Noted   Primary osteoarthritis of right knee 11/20/2020   Restless legs syndrome (RLS) 01/03/2013    PCP: Cleotilde, MD  REFERRING PROVIDER: Aisha, MD  REFERRING DIAG: LBP  Rationale for Evaluation and Treatment: Rehabilitation  THERAPY DIAG:  Other low back pain  Muscle weakness (generalized)  Pain in left hip  Stiffness of left hip, not elsewhere classified  ONSET DATE: 12/22/23  SUBJECTIVE:                                                                                                                                                                                           SUBJECTIVE STATEMENT: Patient reports that she  is walking a little better.  Still very tight, not sleeping great.  Have been doing the easy stuff at home  PERTINENT HISTORY:  TSA, TKA, ACDF  PAIN:  Are you having pain? Yes: NPRS scale: 8/10 Pain location: left SI area Pain description: ache Aggravating factors: difficulty with everything, everything hurts pain up to 10./10 Relieving factors: rest, meds at best pain a 7/10 **Note De-Identified [place] [facility]uscation** PRECAUTIONS: None  RED FLAGS: None   WEIGHT BEARING RESTRICTIONS: No  FALLS:  Has patient fallen in last 6 months? No  LIVING ENVIRONMENT: Lives with: lives with their family Lives in: House/apartment Stairs: No Has following equipment at home: Single point cane  OCCUPATION: retired Community education officer  PLOF: Independent and did all housework, liked to do gardening  PATIENT GOALS: have less pain, be able to do things  NEXT MD VISIT: none scheduled  OBJECTIVE:  Note: Objective measures were completed at Evaluation unless otherwise noted.  DIAGNOSTIC FINDINGS:  X-rays were negative  PATIENT SURVEYS:  ODI 92%  COGNITION: Overall cognitive status: Within functional limits for tasks assessed     SENSATION: WFL  MUSCLE LENGTH: Very tight all over, unable to tolerate much due to pain  POSTURE: rounded shoulders, forward head, and decreased lumbar lordosis  PALPATION: Significant tenderness in the left   LUMBAR ROM: all motions cause pain  AROM eval  Flexion Decreased 75%  Extension Decreased 75%  Right lateral flexion Decreased 75%  Left lateral flexion Decreased 75%  Right rotation   Left rotation    (Blank rows = not tested)  LOWER EXTREMITY ROM:    WFL's with pain   LOWER EXTREMITY MMT:  all about 4-/5 with pain   LUMBAR SPECIAL TESTS:  Straight leg raise test: Positive, Slump test: Positive, and Positive shotgun test, other tests were deferred due to pain  FUNCTIONAL TESTS:  5 times sit to stand: 40 seconds very slow and guarded Timed up and go (TUG): 29 seconds very  unsteady  GAIT: Distance walked: 50 feet Assistive device utilized: Single point cane Level of assistance: Complete Independence Comments: very slow, very guarded, unsteady, tends to hold onto walls  TREATMENT DATE:  01/21/24 Feet on ball K2C, rotation, small bridge, isometric abs Ball b/n knees squeeze Red tband clamshells Discussion on posture and body mechanics Gentle passive ROM LE's STM with the T-gun  to the left low back and buttock IFC in sitting to the left lumbar area REviewed the HEP and demo  01/13/24 Evaluation, STM with the T-gun and then MHP/IFC to the left low back in sitting                                                                                                                              PATIENT EDUCATION:  Education details: POC/HEP Person educated: Patient Education method: Programmer, multimedia, Facilities manager, Verbal cues, and Handouts Education comprehension: verbalized understanding  HOME EXERCISE PROGRAM: Access Code: [email or web address] URL: [email or web address] Date: 01/13/2024 Prepared by: Ozell Mainland  Exercises - Supine Hip Adduction Isometric with Ball  - 2 x daily - 7 x weekly - 1 sets - 10 reps - 3 hold - Hooklying Abduction with Resistance  - 2 x daily - 7 x weekly - 1 sets - 10 reps - 3 hold - Hooklying Single Knee to Chest  - 2 x daily - 7 x weekly - 1 sets - 10 **Note De-Identified [place] Obfuscation** reps - 5 hold - Seated Pelvic Tilt  - 2 x daily - 7 x weekly - 1 sets - 10 reps - 3 hold  ASSESSMENT:  CLINICAL IMPRESSION: Patient reports that she thought what we did really helped, pain an 8/10 today compared to 10/10, she also is walking better, no cane today with better posture and moving faster, initiated more movements and exercises, tolerated well just very slow and guarded with a lot of cues for the exercises  Patient is a 83 y.o. female who was seen today for physical therapy evaluation and treatment for left low back pain, MD feels it is SI related, she is very  guarded and does not move well, all motions increase pain as well as all tests, she did have a significant pop with shotgun test while I was testing MMT hip adduction.  Very tender to the left SI area, mms are guarded   OBJECTIVE IMPAIRMENTS: Abnormal gait, cardiopulmonary status limiting activity, decreased activity tolerance, decreased balance, decreased coordination, decreased endurance, decreased mobility, difficulty walking, decreased ROM, decreased strength, increased muscle spasms, impaired flexibility, improper body mechanics, postural dysfunction, and pain.   ACTIVITY LIMITATIONS: carrying, lifting, bending, sitting, standing, squatting, sleeping, stairs, and locomotion level  PARTICIPATION LIMITATIONS: meal prep, cleaning, laundry, driving, shopping, and community activity  PERSONAL FACTORS: Age, Fitness, Past/current experiences, and 1-2 comorbidities: TKA, TSA are also affecting patient's functional outcome.   REHAB POTENTIAL: Good  CLINICAL DECISION MAKING: Evolving/moderate complexity  EVALUATION COMPLEXITY: Moderate   GOALS: Goals reviewed with patient? Yes  SHORT TERM GOALS: Target date: 02/13/24  Independent with initial HEP Baseline: Goal status: progressing 01/21/24  LONG TERM GOALS: Target date: 04/14/24  Independent with advanced HEP Baseline:  Goal status: INITIAL  2.  Understand posture and body mechanics  Baseline:  Goal status: INITIAL  3.  Report pain decreased 50% Baseline:  Goal status: INITIAL  4.  Decrease TUG time to 19 seconds Baseline:  Goal status: INITIAL  5.  Increase lumbar ROM 25% Baseline:  Goal status: INITIAL  6.  Be able to cook a meal Baseline: has not been able to stand and cook in the past 3 weeks Goal status: INITIAL  PLAN:  PT FREQUENCY: 1-2x/week  PT DURATION: 12 weeks  PLANNED INTERVENTIONS: 97164- PT Re-evaluation, 97110-Therapeutic exercises, 97530- Therapeutic activity, 97112- Neuromuscular re-education,  97535- Self Care, 02859- Manual therapy, [id number]- Electrical stimulation (unattended), 97035- Ultrasound, 02987- Traction (mechanical), Patient/Family education, Balance training, Stair training, Taping, Joint mobilization, Cryotherapy, and Moist heat.  PLAN FOR NEXT SESSION: slowly move, modalities as needed   OBADIAH OZELL ORN, PT 01/21/2024, 5:24 PM

## 2024-01-22 ENCOUNTER — Ambulatory Visit: Admitting: Physical Therapy

## 2024-01-26 ENCOUNTER — Ambulatory Visit: Attending: Family Medicine | Admitting: Physical Therapy

## 2024-01-26 ENCOUNTER — Encounter: Payer: Self-pay | Admitting: Physical Therapy

## 2024-01-26 DIAGNOSIS — M6281 Muscle weakness (generalized): Secondary | ICD-10-CM | POA: Insufficient documentation

## 2024-01-26 DIAGNOSIS — M25652 Stiffness of left hip, not elsewhere classified: Secondary | ICD-10-CM | POA: Diagnosis not present

## 2024-01-26 DIAGNOSIS — M25552 Pain in left hip: Secondary | ICD-10-CM | POA: Insufficient documentation

## 2024-01-26 DIAGNOSIS — M5459 Other low back pain: Secondary | ICD-10-CM | POA: Diagnosis not present

## 2024-01-26 NOTE — Therapy (Signed)
**Note De-Identified Lynde Obfuscation** OUTPATIENT PHYSICAL THERAPY THORACOLUMBAR EVALUATION   Patient Name: Karen Dickerson MRN: [id number] DOB:06/23/1941, 83 y.o., female Today's Date: 01/26/2024  END OF SESSION:  PT End of Session - 01/26/24 1514     Visit Number 3    Date for PT Re-Evaluation 04/14/24    PT Start Time 1515    PT Stop Time 1600    PT Time Calculation (min) 45 min    Activity Tolerance Patient tolerated treatment well    Behavior During Therapy WFL for tasks assessed/performed          Past Medical History:  Diagnosis Date   Arthritis    Bursitis    Fibromyalgia    GERD (gastroesophageal reflux disease)    Hypertension    Osteoporosis    PONV (postoperative nausea and vomiting)    Restless leg syndrome    Past Surgical History:  Procedure Laterality Date   ABDOMINAL HYSTERECTOMY     APPENDECTOMY     CATARACT EXTRACTION  2013   CHOLECYSTECTOMY     COCCYX REMOVAL     COLONOSCOPY     EYE SURGERY     b/l   NECK SURGERY     fusion   SHOULDER ARTHROSCOPY     B/L   TONSILLECTOMY     TOTAL KNEE ARTHROPLASTY Right 11/20/2020   Procedure: RIGHT TOTAL KNEE ARTHROPLASTY;  Surgeon: Sheril Coy, MD;  Location: [facility];  Service: Orthopedics;  Laterality: Right;   TUBAL LIGATION     Patient Active Problem List   Diagnosis Date Noted   Primary osteoarthritis of right knee 11/20/2020   Restless legs syndrome (RLS) 01/03/2013    PCP: Cleotilde, MD  REFERRING PROVIDER: Aisha, MD  REFERRING DIAG: LBP  Rationale for Evaluation and Treatment: Rehabilitation  THERAPY DIAG:  Muscle weakness (generalized)  Other low back pain  Pain in left hip  Stiffness of left hip, not elsewhere classified  ONSET DATE: 12/22/23  SUBJECTIVE:                                                                                                                                                                                           SUBJECTIVE STATEMENT: Paint is about half as it was, hurts to lay on her L  side   PERTINENT HISTORY:  TSA, TKA, ACDF  PAIN:  Are you having pain? Yes: NPRS scale: 5/10 Pain location: left SI area Pain description: ache Aggravating factors: difficulty with everything, everything hurts pain up to 10./10 Relieving factors: rest, meds at best pain a 7/10  PRECAUTIONS: None  RED FLAGS: None   WEIGHT BEARING RESTRICTIONS: No  FALLS: **Note De-Identified Bos Obfuscation** Has patient fallen in last 6 months? No  LIVING ENVIRONMENT: Lives with: lives with their family Lives in: House/apartment Stairs: No Has following equipment at home: Single point cane  OCCUPATION: retired Community education officer  PLOF: Independent and did all housework, liked to do gardening  PATIENT GOALS: have less pain, be able to do things  NEXT MD VISIT: none scheduled  OBJECTIVE:  Note: Objective measures were completed at Evaluation unless otherwise noted.  DIAGNOSTIC FINDINGS:  X-rays were negative  PATIENT SURVEYS:  ODI 92%  COGNITION: Overall cognitive status: Within functional limits for tasks assessed     SENSATION: WFL  MUSCLE LENGTH: Very tight all over, unable to tolerate much due to pain  POSTURE: rounded shoulders, forward head, and decreased lumbar lordosis  PALPATION: Significant tenderness in the left   LUMBAR ROM: all motions cause pain  AROM eval  Flexion Decreased 75%  Extension Decreased 75%  Right lateral flexion Decreased 75%  Left lateral flexion Decreased 75%  Right rotation   Left rotation    (Blank rows = not tested)  LOWER EXTREMITY ROM:    WFL's with pain   LOWER EXTREMITY MMT:  all about 4-/5 with pain   LUMBAR SPECIAL TESTS:  Straight leg raise test: Positive, Slump test: Positive, and Positive shotgun test, other tests were deferred due to pain  FUNCTIONAL TESTS:  5 times sit to stand: 40 seconds very slow and guarded Timed up and go (TUG): 29 seconds very unsteady  GAIT: Distance walked: 50 feet Assistive device utilized: Single point cane Level of  assistance: Complete Independence Comments: very slow, very guarded, unsteady, tends to hold onto walls  TREATMENT DATE:  01/26/24 Gentle passive ROM LE's Feet on ball K2C, rotation, small bridge Ball b/n knees squeeze Red tband clamshells Hooklying w/ march ab set  Sit to stands w/ UE assist STM to the left low back and buttock   01/21/24 Feet on ball K2C, rotation, small bridge, isometric abs Ball b/n knees squeeze Red tband clamshells Discussion on posture and body mechanics Gentle passive ROM LE's STM with the T-gun  to the left low back and buttock IFC in sitting to the left lumbar area REviewed the HEP and demo  01/13/24 Evaluation, STM with the T-gun and then MHP/IFC to the left low back in sitting                                                                                                                              PATIENT EDUCATION:  Education details: POC/HEP Person educated: Patient Education method: Programmer, multimedia, Facilities manager, Verbal cues, and Handouts Education comprehension: verbalized understanding  HOME EXERCISE PROGRAM: Access Code: [email or web address] URL: [email or web address] Date: 01/13/2024 Prepared by: Ozell Mainland  Exercises - Supine Hip Adduction Isometric with Ball  - 2 x daily - 7 x weekly - 1 sets - 10 reps - 3 hold - Hooklying Abduction with Resistance  - 2 x daily - 7 x weekly - 1 sets - **Note De-Identified Erbe Obfuscation** 10 reps - 3 hold - Hooklying Single Knee to Chest  - 2 x daily - 7 x weekly - 1 sets - 10 reps - 5 hold - Seated Pelvic Tilt  - 2 x daily - 7 x weekly - 1 sets - 10 reps - 3 hold  ASSESSMENT:  CLINICAL IMPRESSION: Patient reports that the pain is half better, again no cane today with ambulation, Added mote dynamic core interventions.  All interventions completed well. Cue for core engagement needed with supine interventions. Tenderness over L SI ara with STM.  Patient is a 83 y.o. female who was seen today for physical therapy evaluation and  treatment for left low back pain, MD feels it is SI related, she is very guarded and does not move well, all motions increase pain as well as all tests, she did have a significant pop with shotgun test while I was testing MMT hip adduction.  Very tender to the left SI area, mms are guarded   OBJECTIVE IMPAIRMENTS: Abnormal gait, cardiopulmonary status limiting activity, decreased activity tolerance, decreased balance, decreased coordination, decreased endurance, decreased mobility, difficulty walking, decreased ROM, decreased strength, increased muscle spasms, impaired flexibility, improper body mechanics, postural dysfunction, and pain.   ACTIVITY LIMITATIONS: carrying, lifting, bending, sitting, standing, squatting, sleeping, stairs, and locomotion level  PARTICIPATION LIMITATIONS: meal prep, cleaning, laundry, driving, shopping, and community activity  PERSONAL FACTORS: Age, Fitness, Past/current experiences, and 1-2 comorbidities: TKA, TSA are also affecting patient's functional outcome.   REHAB POTENTIAL: Good  CLINICAL DECISION MAKING: Evolving/moderate complexity  EVALUATION COMPLEXITY: Moderate   GOALS: Goals reviewed with patient? Yes  SHORT TERM GOALS: Target date: 02/13/24  Independent with initial HEP Baseline: Goal status: progressing 01/21/24, Met 01/26/24  LONG TERM GOALS: Target date: 04/14/24  Independent with advanced HEP Baseline:  Goal status: INITIAL  2.  Understand posture and body mechanics  Baseline:  Goal status: INITIAL  3.  Report pain decreased 50% Baseline:  Goal status: INITIAL  4.  Decrease TUG time to 19 seconds Baseline:  Goal status: INITIAL  5.  Increase lumbar ROM 25% Baseline:  Goal status: INITIAL  6.  Be able to cook a meal Baseline: has not been able to stand and cook in the past 3 weeks Goal status: INITIAL  PLAN:  PT FREQUENCY: 1-2x/week  PT DURATION: 12 weeks  PLANNED INTERVENTIONS: 97164- PT Re-evaluation,  97110-Therapeutic exercises, 97530- Therapeutic activity, 97112- Neuromuscular re-education, 97535- Self Care, 02859- Manual therapy, [id number]- Electrical stimulation (unattended), 97035- Ultrasound, 02987- Traction (mechanical), Patient/Family education, Balance training, Stair training, Taping, Joint mobilization, Cryotherapy, and Moist heat.  PLAN FOR NEXT SESSION: slowly move, modalities as needed   Tanda KANDICE Sorrow, PTA 01/26/2024, 3:16 PM

## 2024-01-28 ENCOUNTER — Ambulatory Visit: Admitting: Physical Therapy

## 2024-01-28 DIAGNOSIS — M533 Sacrococcygeal disorders, not elsewhere classified: Secondary | ICD-10-CM | POA: Diagnosis not present

## 2024-01-28 DIAGNOSIS — Z6823 Body mass index (BMI) 23.0-23.9, adult: Secondary | ICD-10-CM | POA: Diagnosis not present

## 2024-02-01 ENCOUNTER — Ambulatory Visit: Admitting: Physical Therapy

## 2024-02-03 ENCOUNTER — Ambulatory Visit: Admitting: Physical Therapy

## 2024-02-03 DIAGNOSIS — D3132 Benign neoplasm of left choroid: Secondary | ICD-10-CM | POA: Diagnosis not present

## 2024-02-03 DIAGNOSIS — Z961 Presence of intraocular lens: Secondary | ICD-10-CM | POA: Diagnosis not present

## 2024-02-08 ENCOUNTER — Ambulatory Visit: Admitting: Physical Therapy

## 2024-02-10 ENCOUNTER — Ambulatory Visit: Admitting: Physical Therapy

## 2024-03-14 DIAGNOSIS — M810 Age-related osteoporosis without current pathological fracture: Secondary | ICD-10-CM | POA: Diagnosis not present

## 2024-03-17 DIAGNOSIS — Z6823 Body mass index (BMI) 23.0-23.9, adult: Secondary | ICD-10-CM | POA: Diagnosis not present

## 2024-03-17 DIAGNOSIS — G8929 Other chronic pain: Secondary | ICD-10-CM | POA: Diagnosis not present

## 2024-03-17 DIAGNOSIS — M545 Low back pain, unspecified: Secondary | ICD-10-CM | POA: Diagnosis not present

## 2024-03-18 ENCOUNTER — Other Ambulatory Visit: Payer: Self-pay | Admitting: Family Medicine

## 2024-03-18 DIAGNOSIS — M545 Low back pain, unspecified: Secondary | ICD-10-CM

## 2024-03-21 ENCOUNTER — Ambulatory Visit
Admission: RE | Admit: 2024-03-21 | Discharge: 2024-03-21 | Disposition: A | Source: Ambulatory Visit | Attending: Family Medicine | Admitting: Family Medicine

## 2024-03-21 DIAGNOSIS — M545 Low back pain, unspecified: Secondary | ICD-10-CM

## 2024-03-21 DIAGNOSIS — M5126 Other intervertebral disc displacement, lumbar region: Secondary | ICD-10-CM | POA: Diagnosis not present

## 2024-03-21 DIAGNOSIS — M47816 Spondylosis without myelopathy or radiculopathy, lumbar region: Secondary | ICD-10-CM | POA: Diagnosis not present

## 2024-03-30 ENCOUNTER — Emergency Department (HOSPITAL_COMMUNITY)
Admission: EM | Admit: 2024-03-30 | Discharge: 2024-03-31 | Disposition: A | Discharge status: Home / Self Care | Attending: Emergency Medicine | Admitting: Emergency Medicine

## 2024-03-30 ENCOUNTER — Emergency Department (HOSPITAL_COMMUNITY)

## 2024-03-30 ENCOUNTER — Other Ambulatory Visit: Payer: Self-pay

## 2024-03-30 ENCOUNTER — Encounter (HOSPITAL_COMMUNITY): Payer: Self-pay

## 2024-03-30 DIAGNOSIS — R079 Chest pain, unspecified: Secondary | ICD-10-CM | POA: Insufficient documentation

## 2024-03-30 DIAGNOSIS — R0682 Tachypnea, not elsewhere classified: Secondary | ICD-10-CM | POA: Diagnosis not present

## 2024-03-30 DIAGNOSIS — R0789 Other chest pain: Secondary | ICD-10-CM | POA: Diagnosis not present

## 2024-03-30 DIAGNOSIS — K449 Diaphragmatic hernia without obstruction or gangrene: Secondary | ICD-10-CM | POA: Diagnosis not present

## 2024-03-30 DIAGNOSIS — E871 Hypo-osmolality and hyponatremia: Secondary | ICD-10-CM | POA: Diagnosis not present

## 2024-03-30 DIAGNOSIS — I10 Essential (primary) hypertension: Secondary | ICD-10-CM | POA: Diagnosis not present

## 2024-03-30 DIAGNOSIS — J449 Chronic obstructive pulmonary disease, unspecified: Secondary | ICD-10-CM | POA: Diagnosis not present

## 2024-03-30 DIAGNOSIS — Z79899 Other long term (current) drug therapy: Secondary | ICD-10-CM | POA: Insufficient documentation

## 2024-03-30 LAB — BASIC METABOLIC PANEL WITH GFR
Anion gap: 13 (ref 5–15)
BUN: 15 mg/dL (ref 8–23)
CO2: 25 mmol/L (ref 22–32)
Calcium: 10.2 mg/dL (ref 8.9–10.3)
Chloride: 95 mmol/L — ABNORMAL LOW (ref 98–111)
Creatinine, Ser: 0.78 mg/dL (ref 0.44–1.00)
GFR, Estimated: >60 mL/min (ref >60)
Glucose, Bld: 113 mg/dL — ABNORMAL HIGH (ref 70–99)
Potassium: 3.7 mmol/L (ref 3.5–5.1)
Sodium: 133 mmol/L — ABNORMAL LOW (ref 135–145)

## 2024-03-30 LAB — CBC
HCT: 42.9 % (ref 36.0–46.0)
Hemoglobin: 14.4 g/dL (ref 12.0–15.0)
MCH: 31 pg (ref 26.0–34.0)
MCHC: 33.6 g/dL (ref 30.0–36.0)
MCV: 92.5 fL (ref 80.0–100.0)
Platelets: 309 K/uL (ref 150–400)
RBC: 4.64 MIL/uL (ref 3.87–5.11)
RDW: 11.9 % (ref 11.5–15.5)
WBC: 7.1 K/uL (ref 4.0–10.5)
nRBC: 0 % (ref 0.0–0.2)

## 2024-03-30 LAB — TROPONIN I (HIGH SENSITIVITY)
Troponin I (High Sensitivity): 7 ng/L (ref <18)
Troponin I (High Sensitivity): 9 ng/L (ref <18)

## 2024-03-30 LAB — BRAIN NATRIURETIC PEPTIDE: B Natriuretic Peptide: 34.5 pg/mL (ref 0.0–100.0)

## 2024-03-30 MED ORDER — MORPHINE SULFATE (PF) 4 MG/ML IV SOLN
4.0000 mg | Freq: Once | INTRAVENOUS | Status: AC
  Administered 2024-03-30: 4 mg via INTRAVENOUS
  Filled 2024-03-30: qty 1

## 2024-03-30 MED ORDER — NITROGLYCERIN 0.4 MG SL SUBL
0.4000 mg | SUBLINGUAL_TABLET | SUBLINGUAL | Status: DC | PRN
  Administered 2024-03-30: 0.4 mg via SUBLINGUAL
  Filled 2024-03-30 (×2): qty 1

## 2024-03-30 MED ORDER — IOHEXOL 350 MG/ML SOLN
75.0000 mL | Freq: Once | INTRAVENOUS | Status: AC | PRN
  Administered 2024-03-30: 75 mL via INTRAVENOUS

## 2024-03-30 NOTE — ED Provider Notes (Signed)
**Note De-Identified Kepner Obfuscation** [facility] Provider Note   CSN: [id number] Arrival date & time: 03/30/24  1653     Patient presents with: Chest Pain   Karen Dickerson is a 83 y.o. female with past medical history seen for hypertension, fibromyalgia, GERD who presents concern for left-sided nonradiating chest pain started this morning.  She denies any nausea, vomiting, shortness of breath.  She endorses some mild weakness in her arms.  She received 324 mg aspirin  and nitroglycerin initially with some relief of symptoms, on ED arrival patient with crushing 10/10 chest pain, some radiation, denies any pleuritic pain.  Recent trip to the beach.    Chest Pain      Prior to Admission medications   Medication Sig Start Date End Date Taking? Authorizing Provider  acetaminophen  (TYLENOL ) 500 MG tablet Take 500 mg by mouth every 8 (eight) hours as needed for moderate pain.    [provider]  amLODipine  (NORVASC ) 10 MG tablet Take 10 mg by mouth daily.    [provider]  omeprazole (PRILOSEC) 20 MG capsule Take 20 mg by mouth daily.    [provider]  pramipexole  (MIRAPEX ) 0.125 MG tablet Take 0.125 mg by mouth 2 (two) times daily. 11/03/12   [provider]  tiZANidine  (ZANAFLEX ) 2 MG tablet Take 2 mg by mouth every 6 (six) hours as needed for muscle spasms.    [provider]  traMADol (ULTRAM) 50 MG tablet Take 50 mg by mouth in the morning and at bedtime.    [provider]  triamterene -hydrochlorothiazide  (MAXZIDE -25) 37.5-25 MG tablet Take 0.5 tablets by mouth every morning. 09/25/20   [provider]  trimethoprim  (TRIMPEX ) 100 MG tablet Take 100 mg by mouth See admin instructions. Take twice daily for 3 days as needed    [provider]    Allergies: Fish oil and Penicillins    Review of Systems  Cardiovascular:  Positive for chest pain.  All other systems reviewed and are negative.   Updated  Vital Signs BP 122/65   Pulse 63   Temp 97.6 F (36.4 C) (Oral)   Resp 19   Ht 5' 2 (1.575 m)   Wt 58.1 kg   SpO2 95%   BMI 23.41 kg/m   Physical Exam Vitals and nursing note reviewed.  Constitutional:      General: She is not in acute distress.    Appearance: Normal appearance.  HENT:     Head: Normocephalic and atraumatic.  Eyes:     General:        Right eye: No discharge.        Left eye: No discharge.  Cardiovascular:     Rate and Rhythm: Normal rate and regular rhythm.     Heart sounds: No murmur heard.    No friction rub. No gallop.  Pulmonary:     Effort: Pulmonary effort is normal. Tachypnea present.     Breath sounds: Normal breath sounds.     Comments: No wheezing, rhonchi, stridor, rales Abdominal:     General: Bowel sounds are normal.     Palpations: Abdomen is soft.  Skin:    General: Skin is warm and dry.     Capillary Refill: Capillary refill takes less than 2 seconds.  Neurological:     Mental Status: She is alert and oriented to person, place, and time.  Psychiatric:        Mood and Affect: Mood is anxious. **Note De-Identified Villanueva Obfuscation** Behavior: Behavior normal.     (all labs ordered are listed, but only abnormal results are displayed) Labs Reviewed  BASIC METABOLIC PANEL WITH GFR - Abnormal; Notable for the following components:      Result Value   Sodium 133 (*)    Chloride 95 (*)    Glucose, Bld 113 (*)    All other components within normal limits  CBC  BRAIN NATRIURETIC PEPTIDE  TROPONIN I (HIGH SENSITIVITY)  TROPONIN I (HIGH SENSITIVITY)    EKG: EKG Interpretation Date/Time:  Wednesday March 30 2024 18:41:37 EDT Ventricular Rate:  71 PR Interval:  228 QRS Duration:  128 QT Interval:  394 QTC Calculation: 426 R Axis:   73  Text Interpretation: Sinus rhythm Prolonged PR interval Left atrial enlargement Nonspecific intraventricular conduction delay Confirmed by Elnor Savant ([id number]) on 03/30/2024 8:02:38 PM  Radiology: CT Angio Chest PE W and/or Wo  Contrast Result Date: 03/30/2024 CLINICAL DATA:  Pulmonary embolus suspected with high probability. Pleuritic severe chest pain and back pain. EXAM: CT ANGIOGRAPHY CHEST WITH CONTRAST TECHNIQUE: Multidetector CT imaging of the chest was performed using the standard protocol during bolus administration of intravenous contrast. Multiplanar CT image reconstructions and MIPs were obtained to evaluate the vascular anatomy. RADIATION DOSE REDUCTION: This exam was performed according to the departmental dose-optimization program which includes automated exposure control, adjustment of the mA and/or kV according to patient size and/or use of iterative reconstruction technique. CONTRAST:  75mL OMNIPAQUE  IOHEXOL  350 MG/ML SOLN COMPARISON:  08/23/2021 FINDINGS: Cardiovascular: Technically adequate study with good opacification of the central and segmental pulmonary arteries. Mild motion artifact. No focal filling defects. No evidence of significant pulmonary embolus. Normal heart size. No pericardial effusions. Normal caliber thoracic aorta. No aortic dissection. Great vessel origins are patent. Mediastinum/Nodes: Large esophageal hiatal hernia. Esophagus is decompressed. Scattered lymph nodes in the mediastinum are not pathologically enlarged, likely reactive. Thyroid  gland is unremarkable. Lungs/Pleura: Emphysematous changes in the lungs. Diffuse mosaic attenuation pattern may represent edema, air trapping, or multifocal pneumonia. No pleural effusion or pneumothorax. Upper Abdomen: No acute abnormality. Musculoskeletal: No chest wall abnormality. Thoracic scoliosis convex towards the right. No acute or significant osseous findings. Review of the MIP images confirms the above findings. IMPRESSION: 1. No evidence of significant pulmonary embolus. 2. Mosaic attenuation pattern to the lungs may represent air trapping, edema, or pneumonia. 3. Emphysematous changes in the lungs. 4. Large esophageal hiatal hernia. Electronically  Signed   By: Elsie Gravely M.D.   On: 03/30/2024 18:47     Procedures   Medications Ordered in the ED  nitroGLYCERIN (NITROSTAT) SL tablet 0.4 mg (0.4 mg Sublingual Given 03/30/24 1810)  iohexol  (OMNIPAQUE ) 350 MG/ML injection 75 mL (75 mLs Intravenous Contrast Given 03/30/24 1840)  morphine  (PF) 4 MG/ML injection 4 mg (4 mg Intravenous Given 03/30/24 1848)    Clinical Course as of 03/30/24 2326  Wed Mar 30, 2024  1830 Crushing pain after returning from beach. 10/10. Radiates to back. nitroglycerin with minimal relief. Repeat EKG slightly more suspicious. First trop ok so needs delta.  [CP]    Clinical Course User Index [CP] Rosan Sherlean DEL, PA-C                                 Medical Decision Making Amount and/or Complexity of Data Reviewed Labs: ordered. Radiology: ordered.  Risk Prescription drug management.   This patient is a 83 y.o. female  who **Note De-Identified Klingler Obfuscation** presents to the ED for concern of chest pain.   Differential diagnoses prior to evaluation: The emergent differential diagnosis includes, but is not limited to,  ACS, AAS, PE, Mallory-Weiss, Boerhaave's, Pneumonia, acute bronchitis, asthma or COPD exacerbation, anxiety, MSK pain or traumatic injury to the chest, acid reflux versus other . This is not an exhaustive differential.   Past Medical History / Co-morbidities / Social History: hypertension, fibromyalgia, GERD  Physical Exam: Physical exam performed. The pertinent findings include: Initially with some hypertension, blood pressure 143/81, she is tachypneic, respirations 25.  Stable oxygen saturation on room air.  No wheezing, rhonchi, stridor.  She appears quite uncomfortable, anxious, chest pain is not reproducible on palpation.  Lab Tests/Imaging studies: I personally interpreted labs/imaging and the pertinent results include: CBC unremarkable, BMP with mild hyponatremia, sodium 133, mild hypochloremia, chloride 95, otherwise unremarkable.  Her initial troponin is  normal at 7.  CT angio with no evidence of blood clot, acute aortic syndrome, she has mosaic hypoattenuation pattern of the lungs consistent with edema versus infection versus emphysematous changes, clinically symptoms seem less consistent with an acute pneumonia process, heart failure possible, but not necessarily explanation for her symptoms today.  Large hiatal hernia also noted, she does have a history of GERD, this may be a primary esophageal presentation.SABRA  Pete troponin at 9 I agree with the radiologist interpretation.  Cardiac monitoring: EKG obtained and interpreted by myself and attending physician which shows: Normal sinus rhythm, prolonged PR, nonspecific interventricular conduction delay, no evidence of acute ST change.   Medications: I ordered medication including morphine , nitroglycerin for suspicious chest pain.  I have reviewed the patients home medicines and have made adjustments as needed.  Consults: She has not seen cardiology in 10 years or more, has hypertension, advanced age, worrisome story of chest pain with crushing chest pain, 10/10, and still having some ongoing chest pain.  Spoke with cardiologist, Dr. Orlando, who will evaluate at bedside  Final diagnoses:  None    ED Discharge Orders     None          Rosan Sherlean DEL, PA-C 03/30/24 2326    Elnor Savant A, DO 04/03/24 1512

## 2024-03-30 NOTE — ED Triage Notes (Signed)
**Note De-Identified Dungan Obfuscation** Pt here for left sided non-radiating CP that started this morning. Denies n/v/shob. Pt has weakness in both arms. Axox4. VSS.  EMS gave 324mg  of aspirin  and 0.4 of nitroglycerin.

## 2024-03-30 NOTE — ED Notes (Addendum)
 Pt ambulated to the bathroom without issue

## 2024-03-30 NOTE — ED Provider Triage Note (Signed)
**Note De-Identified Marling Obfuscation** Emergency Medicine Provider Triage Evaluation Note  Karen Dickerson , a 83 y.o. female  was evaluated in triage.  Pt complains of severe left-sided chest pain..  Review of Systems  Positive: Pleuritic pain, some shortness of breath, improved with aspirin  and nitro, mild weakness in both arms.  Recent travel from the beach. Negative: No trauma,  Physical Exam  BP (!) 143/81 (BP Location: Right Arm)   Pulse (!) 16   Temp 97.6 F (36.4 C) (Oral)   Resp 16   Ht 5' 2 (1.575 m)   Wt 58.1 kg   SpO2 98%   BMI 23.41 kg/m  Gen:   Awake, ill-appearing and looks slightly clammy. Resp:  Normal effort  MSK:   Moves extremities without difficulty, intact pulses in extremities, no significant leg edema. Other:  Ill appearance  Medical Decision Making  Medically screening exam initiated at 5:56 PM.  Appropriate orders placed.  Karen Dickerson was informed that the remainder of the evaluation will be completed by another provider, this initial triage assessment does not replace that evaluation, and the importance of remaining in the ED until their evaluation is complete.  Karen Dickerson is a 83 y.o. female with a past medical history significant for hypertension, for myalgia, GERD, and osteoporosis who presents with severe chest pain.  According patient, she just returned back from [facility] and today started having chest pain.  She reports it is a crushing pain in her left chest and does go towards her back.  She reports it is very pleuritic and it hurts to take a deep breath.  She has some mild shortness of breath but denies other cough or congestion or infectious symptoms.  Denies significant nausea, vomiting, constipation, diarrhea, or urinary changes.  Denies leg pain or leg swelling has no history of blood clots.  She reports that she called for help and EMS gave her aspirin  and nitro and the pain improved although while sitting in  EMS triage, her pain has rapidly worsened.  It is now 10  out of 10 again and she is clutching her chest and leaning over.  She is very ill-appearing.  On my exam, I do not hear murmur.  Lungs are clear.  Chest is slightly tender but abdomen is nontender.  She has intact pulses.  Patient is somewhat clammy.  Back is nontender.  No focal neurologic deficit on my exam initially.  EKG initially did not show concerning findings such as STEMI however due to her rapid worsening and chest pain we will get a repeat EKG.  With her travel from the beach and this pleuritic discomfort we will order a CT PE study and will give her another nitro.  Will try to get her to a room soon as possible.    Roney Youtz, Lonni PARAS, MD 03/30/24 [phone]

## 2024-03-31 DIAGNOSIS — R079 Chest pain, unspecified: Secondary | ICD-10-CM

## 2024-03-31 NOTE — ED Provider Notes (Signed)
**Note De-identified Penninger Obfuscation**  **Note De-Identified Shedrick Obfuscation** 3:00 AM Patient has been evaluated by cardiology, Dr. Orlando, feels stable for discharge given reassuring work-up.  Will place ambulatory referral to cardiology clinic.  Can follow-up with PCP in the interim.  Return here for new concerns.   Jarold Olam HERO, PA-C 03/31/24 [id number]    Bari Charmaine FALCON, MD 04/01/24 [phone]

## 2024-03-31 NOTE — Discharge Instructions (Addendum)
**Note De-Identified Staller Obfuscation** Your cardiac work-up was reassuring today but it is recommended that you follow-up with cardiology as an outpatient. I have placed an ambulatory referral for you-- they should contact you with appt but if you do not hear from them in a day or two, please follow-up about this. Recommend to follow-up with your primary care doctor in the interim. Return here for new concerns.

## 2024-03-31 NOTE — Consult Note (Signed)
**Note De-Identified [place]ron Obfuscation** Cardiology Consultation   Patient ID: Karen Dickerson MRN: [id number]; DOB: Jun 26, 1940  Admit date: 03/30/2024 Date of Consult: 03/31/2024  PCP:  Aisha Harvey, MD   [facility] Providers Cardiologist:  None      Patient Profile: Karen Dickerson is a 83 y.o. female with a hx of hypertension, arthritis, fibromyalgia who is being seen 03/31/2024 for the evaluation of chest pain at the request of the ED team.  History of Present Illness: Karen Dickerson presents today with chest pain that started yesterday. She returned from [facility] [place] on Tuesday afternoon. She had an acute onset of chest pain around 8pm on Wednesday. The pain is worse with palpitation and worse with lying back. At first, she had chest pain with talking a deep breath. No worsening pain with exertion. The pain was not better with rest. She did have some relief with morphine . She did not have much relief with nitroglycerin, only mild. She called 911 and was brought here to the ED for further evaluation.  She has never had this pain before. No cough or recent viral illness.   She is a non smoker and does not have a history of smoking.   Pain better with morphine  and worse with palpation and lying back. She denies any out of the ordinary work or exertion.  She is limited by her back pain and walks with a cane. She is not able to walk up a flight of stairs due to her arthritis and MSK pain. No DOE or leg swelling. No orthopnea.   Past Medical History:  Diagnosis Date   Arthritis    Bursitis    Fibromyalgia    GERD (gastroesophageal reflux disease)    Hypertension    Osteoporosis    PONV (postoperative nausea and vomiting)    Restless leg syndrome     Past Surgical History:  Procedure Laterality Date   ABDOMINAL HYSTERECTOMY     APPENDECTOMY     CATARACT EXTRACTION  2013   CHOLECYSTECTOMY     COCCYX REMOVAL     COLONOSCOPY     EYE SURGERY     b/l   NECK SURGERY     fusion   SHOULDER ARTHROSCOPY      B/L   TONSILLECTOMY     TOTAL KNEE ARTHROPLASTY Right 11/20/2020   Procedure: RIGHT TOTAL KNEE ARTHROPLASTY;  Surgeon: Sheril Coy, MD;  Location: [facility];  Service: Orthopedics;  Laterality: Right;   TUBAL LIGATION       Home Medications:  Prior to Admission medications   Medication Sig Start Date End Date Taking? Authorizing Provider  acetaminophen  (TYLENOL ) 500 MG tablet Take 500 mg by mouth every 8 (eight) hours as needed for moderate pain.    [provider]  amLODipine  (NORVASC ) 10 MG tablet Take 10 mg by mouth daily.    [provider]  omeprazole (PRILOSEC) 20 MG capsule Take 20 mg by mouth daily.    [provider]  pramipexole  (MIRAPEX ) 0.125 MG tablet Take 0.125 mg by mouth 2 (two) times daily. 11/03/12   [provider]  tiZANidine  (ZANAFLEX ) 2 MG tablet Take 2 mg by mouth every 6 (six) hours as needed for muscle spasms.    [provider]  traMADol (ULTRAM) 50 MG tablet Take 50 mg by mouth in the morning and at bedtime.    [provider]  triamterene -hydrochlorothiazide  (MAXZIDE -25) 37.5-25 MG tablet Take 0.5 tablets by mouth every morning. 09/25/20   [provider] **Note De-Identified [place] Obfuscation** trimethoprim  (TRIMPEX ) 100 MG tablet Take 100 mg by mouth See admin instructions. Take twice daily for 3 days as needed    [provider]    Scheduled Meds:  Continuous Infusions:  PRN Meds: nitroGLYCERIN  Allergies:    Allergies  Allergen Reactions   Fish Oil Swelling   Penicillins Swelling    Social History:   Social History   Socioeconomic History   Marital status: Married    Spouse name: Karen Dickerson   Number of children: 2   Years of education: 12th   Highest education level: Not on file  Occupational History   Occupation: Retired  Tobacco Use   Smoking status: Never   Smokeless tobacco: Never  Vaping Use   Vaping status: Never Used  Substance and Sexual Activity   Alcohol use: No   Drug use: No   Sexual  activity: Not Currently  Other Topics Concern   Not on file  Social History Narrative   Patient lives at home with spouse.   Caffeine Use: none   Social Drivers of Corporate investment banker Strain: Not on file  Food Insecurity: Not on file  Transportation Needs: Not on file  Physical Activity: Not on file  Stress: Not on file  Social Connections: Not on file  Intimate Partner Violence: Not on file    Family History:    Family History  Problem Relation Age of Onset   Stroke Mother    Lung disease Father      ROS:  Please see the history of present illness.  All other ROS reviewed and negative.     Physical Exam/Data: Vitals:   03/30/24 2115 03/30/24 2325 03/31/24 0000 03/31/24 0015  BP: 122/65  126/65 108/63  Pulse: 63  60 61  Resp: 19  (!) 30 20  Temp:  98.4 F (36.9 C)    TempSrc:  Oral    SpO2: 95%  97% 95%  Weight:      Height:       No intake or output data in the 24 hours ending 03/31/24 0246    03/30/2024    4:55 PM 03/30/2023   12:23 PM 08/23/2021    5:06 PM  Last 3 Weights  Weight (lbs) 128 lb 138 lb 11.2 oz 139 lb 1.8 oz  Weight (kg) 58.06 kg 62.914 kg 63.1 kg     Body mass index is 23.41 kg/m.  General:  Well nourished, well developed, in no acute distress HEENT: normal Neck: no JVD Vascular: Distal pulses 2+ bilaterally Chest: pain reproducible with palpation Cardiac:  normal S1, S2; RRR; no murmur  Lungs:  clear to auscultation bilaterally, no wheezing, rhonchi or rales  Abd: soft, nontender, no hepatomegaly  Ext: no edema Musculoskeletal:  No deformities, BUE and BLE strength normal and equal Skin: warm and dry  Neuro:  no focal abnormalities noted Psych:  Normal affect   EKG:  The EKG was personally reviewed and demonstrates:  SR, narrow QRS, no concerning STT wave changes Telemetry:  Telemetry was personally reviewed and demonstrates:  SR with occasional PVC couplets  Laboratory Data: High Sensitivity Troponin:   Recent Labs  Lab  03/30/24 1658 03/30/24 2146  TROPONINIHS 7 9     Chemistry Recent Labs  Lab 03/30/24 1658  NA 133*  K 3.7  CL 95*  CO2 25  GLUCOSE 113*  BUN 15  CREATININE 0.78  CALCIUM 10.2  GFRNONAA >60  ANIONGAP 13    Hematology Recent Labs  Lab **Note De-Identified Gerwig Obfuscation** 03/30/24 1658  WBC 7.1  RBC 4.64  HGB 14.4  HCT 42.9  MCV 92.5  MCH 31.0  MCHC 33.6  RDW 11.9  PLT 309   Thyroid  No results for input(s): TSH, FREET4 in the last 168 hours.  BNP Recent Labs  Lab 03/30/24 1658  BNP 34.5   Radiology/Studies:  CT Angio Chest PE W and/or Wo Contrast Result Date: 03/30/2024 CLINICAL DATA:  Pulmonary embolus suspected with high probability. Pleuritic severe chest pain and back pain. EXAM: CT ANGIOGRAPHY CHEST WITH CONTRAST TECHNIQUE: Multidetector CT imaging of the chest was performed using the standard protocol during bolus administration of intravenous contrast. Multiplanar CT image reconstructions and MIPs were obtained to evaluate the vascular anatomy. RADIATION DOSE REDUCTION: This exam was performed according to the departmental dose-optimization program which includes automated exposure control, adjustment of the mA and/or kV according to patient size and/or use of iterative reconstruction technique. CONTRAST:  75mL OMNIPAQUE  IOHEXOL  350 MG/ML SOLN COMPARISON:  08/23/2021 FINDINGS: Cardiovascular: Technically adequate study with good opacification of the central and segmental pulmonary arteries. Mild motion artifact. No focal filling defects. No evidence of significant pulmonary embolus. Normal heart size. No pericardial effusions. Normal caliber thoracic aorta. No aortic dissection. Great vessel origins are patent. Mediastinum/Nodes: Large esophageal hiatal hernia. Esophagus is decompressed. Scattered lymph nodes in the mediastinum are not pathologically enlarged, likely reactive. Thyroid  gland is unremarkable. Lungs/Pleura: Emphysematous changes in the lungs. Diffuse mosaic attenuation pattern may  represent edema, air trapping, or multifocal pneumonia. No pleural effusion or pneumothorax. Upper Abdomen: No acute abnormality. Musculoskeletal: No chest wall abnormality. Thoracic scoliosis convex towards the right. No acute or significant osseous findings. Review of the MIP images confirms the above findings. IMPRESSION: 1. No evidence of significant pulmonary embolus. 2. Mosaic attenuation pattern to the lungs may represent air trapping, edema, or pneumonia. 3. Emphysematous changes in the lungs. 4. Large esophageal hiatal hernia. Electronically Signed   By: Elsie Gravely M.D.   On: 03/30/2024 18:47   Assessment and Plan: Atypical, noncardiac chest pain  Pleurisy  Occasional PVCs/ventricular couplets Hypertension The patient's story is very reassuring, and I have very low suspicion for a cardiac etiology. Her risk factors really only include htn. Her pain is reproducible on my examination and is nonexertional. Troponin levels are negative and flat, and she has no heart failure symptoms. It does have a positional component and is worse with a deep breath. However, her pain has subsided, and she has no recent viral illness. Low suspicion for pericarditis. I offered her admission for an echo in the morning, however, she prefers to go home, which is a reasonable option. She will need stress testing and echo as an outpatient. I gave her clear return precautions and asked her to discuss with her PCP if she could use NSAIDs for a short period of time (she had been told to avoid them). Her pain is likely MSK in nature and possibly from her beach trip. She has no infectious symptoms as well. I have asked the ED to place a cardiology referral for her.  Risk Assessment/Risk Scores:   For questions or updates, please contact [facility] Please consult [email or web address] for contact info under    Signed, Jerrell DELENA Orchard, MD  03/31/2024 2:46 AM

## 2024-03-31 NOTE — ED Notes (Addendum)
Pt resting with daughter at bedside.

## 2024-04-04 NOTE — Progress Notes (Unsigned)
**Note De-Identified [place] Obfuscation** Cardiology Office Note:    Date:  04/05/2024   ID:  Karen Dickerson, DOB 02/17/1941, MRN [id number]  PCP:  Aisha Harvey, MD   [facility] Providers Cardiologist:  Lonni LITTIE Nanas, MD Cardiology APP:  Madie Jon Garre, PA     Referring MD: Jarold Olam HERO, PA-C   Chief Complaint  Patient presents with   New Patient (Initial Visit)    Chest pain    History of Present Illness:    Karen Dickerson is a 83 y.o. female with a hx of HTN, arthritis, and fibromyalgia.   She was seen in the ED 03/30/2024 with chest pain.  She ruled out with negative cardiac enzymes and nonischemic EKG.  BNP 34.  CTA was negative for PE, showed emphysematous changes in the lungs, large esophageal hiatal hernia.    Chest pain felt atypical and was reproducible on exam. CP had a positional component and pleuritic, but CP subsided, no recent viral illnesses. She opted to discharge without admission with close follow up.   She is here with her daughter. She was just at the beach with family prior to ER visit. She walks with a cane, no lifting.   Bilateral arm pain while making dinner, CP persisted and EMS dispatched. In the ER she had a terrible experience. She has chronic pain with a high pain tolerance. Morphine  relieved her pain. She continues to have chest discomfort.   CP worse with deep inspiration. CP worse when sitting up in the chair, some explanation of symptoms correlated with bad experience in the ER (sat in the hall for hours). Since ER visit, she continues to have chest soreness. CP now with shock feeling, like lightening. She has also tried heat with some improvement.   She has chronic back pain and is seeing NSG tomorrow.   She is taking 4 aleve tablets four times per day. No signs of GI bleeding.   Past Medical History:  Diagnosis Date   Arthritis    Bursitis    Fibromyalgia    GERD (gastroesophageal reflux disease)    Hypertension    Osteoporosis    PONV  (postoperative nausea and vomiting)    Restless leg syndrome     Past Surgical History:  Procedure Laterality Date   ABDOMINAL HYSTERECTOMY     APPENDECTOMY     CATARACT EXTRACTION  2013   CHOLECYSTECTOMY     COCCYX REMOVAL     COLONOSCOPY     EYE SURGERY     b/l   NECK SURGERY     fusion   SHOULDER ARTHROSCOPY     B/L   TONSILLECTOMY     TOTAL KNEE ARTHROPLASTY Right 11/20/2020   Procedure: RIGHT TOTAL KNEE ARTHROPLASTY;  Surgeon: Sheril Coy, MD;  Location: [facility];  Service: Orthopedics;  Laterality: Right;   TUBAL LIGATION      Current Medications: Current Meds  Medication Sig   acetaminophen  (TYLENOL ) 500 MG tablet Take 1,000 mg by mouth 3 (three) times daily as needed.   amLODipine  (NORVASC ) 10 MG tablet Take 10 mg by mouth daily.   COLESTID 1 g tablet Take 1 g by mouth daily.   D-Mannose 500 MG CAPS 1 capsule at bedtime.   denosumab  (PROLIA ) 60 MG/ML SOSY injection Inject 60 mg into the skin every 6 (six) months.   diclofenac Sodium (VOLTAREN) 1 % GEL Apply topically 4 (four) times daily.   famotidine (PEPCID) 20 MG tablet Take 20 mg by mouth daily. Per patient **Note De-Identified [place] Obfuscation** taking 2 tablets in the morning.   ferrous sulfate 325 (65 FE) MG tablet Take 325 mg by mouth daily.   methocarbamol  (ROBAXIN ) 500 MG tablet Take 500 mg by mouth daily.   naproxen sodium (ALEVE) 220 MG tablet Take 220 mg by mouth 2 (two) times daily as needed.   omeprazole (PRILOSEC) 20 MG capsule Take 20 mg by mouth daily.   pramipexole  (MIRAPEX ) 0.125 MG tablet Take 0.125 mg by mouth 2 (two) times daily.   traMADol (ULTRAM) 50 MG tablet Take 50 mg by mouth in the morning and at bedtime. (Patient taking differently: Take 50 mg by mouth daily.)   triamterene -hydrochlorothiazide  (MAXZIDE -25) 37.5-25 MG tablet Take 0.5 tablets by mouth every morning.   trimethoprim  (TRIMPEX ) 100 MG tablet Take 100 mg by mouth See admin instructions. Take twice daily for 3 days as needed     Allergies:   Fish oil and  Penicillins   Social History   Socioeconomic History   Marital status: Married    Spouse name: Kayla   Number of children: 2   Years of education: 12th   Highest education level: Not on file  Occupational History   Occupation: Retired  Tobacco Use   Smoking status: Never   Smokeless tobacco: Never  Vaping Use   Vaping status: Never Used  Substance and Sexual Activity   Alcohol use: No   Drug use: No   Sexual activity: Not Currently  Other Topics Concern   Not on file  Social History Narrative   Patient lives at home with spouse.   Caffeine Use: none   Social Drivers of Corporate investment banker Strain: Not on file  Food Insecurity: Not on file  Transportation Needs: Not on file  Physical Activity: Not on file  Stress: Not on file  Social Connections: Not on file     Family History: The patient's family history includes Lung disease in her father; Stroke in her mother.  ROS:   Please see the history of present illness.     All other systems reviewed and are negative.  EKGs/Labs/Other Studies Reviewed:    The following studies were reviewed today:  EKG Interpretation Date/Time:  Tuesday April 05 2024 14:16:39 EDT Ventricular Rate:  82 PR Interval:  168 QRS Duration:  84 QT Interval:  344 QTC Calculation: 401 R Axis:   15  Text Interpretation: Sinus rhythm with occasional Premature ventricular complexes Nonspecific ST abnormality When compared with ECG of 31-Mar-2024 00:38, PREVIOUS ECG IS PRESENT Confirmed by Madie Slough ([id number]) on 04/05/2024 2:25:15 PM    Recent Labs: 03/30/2024: B Natriuretic Peptide 34.5; BUN 15; Creatinine, Ser 0.78; Hemoglobin 14.4; Platelets 309; Potassium 3.7; Sodium 133  Recent Lipid Panel No results found for: CHOL, TRIG, HDL, CHOLHDL, VLDL, LDLCALC, LDLDIRECT   Risk Assessment/Calculations:                Physical Exam:    VS:  BP 120/80   Pulse 82   Ht 5' 2 (1.575 m)   Wt 130 lb 6.4 oz (59.1 kg)    SpO2 97%   BMI 23.85 kg/m     Wt Readings from Last 3 Encounters:  04/05/24 130 lb 6.4 oz (59.1 kg)  03/30/24 128 lb (58.1 kg)  03/30/23 138 lb 11.2 oz (62.9 kg)     GEN:  Well nourished, well developed in no acute distress HEENT: Normal NECK: No JVD; No carotid bruits LYMPHATICS: No lymphadenopathy CARDIAC: RRR, no murmurs, rubs, gallops, tender to **Note De-Identified Mura Obfuscation** palpation on exam RESPIRATORY:  Clear to auscultation without rales, wheezing or rhonchi  ABDOMEN: Soft, non-tender, non-distended MUSCULOSKELETAL:  No edema; No deformity  SKIN: Warm and dry NEUROLOGIC:  Alert and oriented x 3 PSYCHIATRIC:  Normal affect   ASSESSMENT:    1. Chest pain of uncertain etiology   2. Primary hypertension   3. Other chronic pain    PLAN:    In order of problems listed above:  Chest pain - felt related to MSK - continues to have pinpoint tenderness worse with palpation on exam - given pleuritic nature, will continue NSAID, athough at a lower dose, continue tylenol  and home tramadol - will obtain echo to rule out effusion   Hypertension - continue 10 mg amlodipine , triamterene -hydrochlorothiazide  37.5-25 (taking 1/2 tablet) - no changes in medications   Chronic pain - reduced her aleve dose to avoid GI bleeding   Follow up with Dr. Kate, patient requested. He sees her husband.  Follow up in 1 month.            Medication Adjustments/Labs and Tests Ordered: Current medicines are reviewed at length with the patient today.  Concerns regarding medicines are outlined above.  Orders Placed This Encounter  Procedures   EKG 12-Lead   ECHOCARDIOGRAM COMPLETE   No orders of the defined types were placed in this encounter.   Patient Instructions  Medication Instructions:  1.Reduce aleve to 220 mg twice daily. 2.May take 1000 mg of tylenol  3 times daily. 3.May use voltaren gel as needed *If you need a refill on your cardiac medications before your next appointment, please call  your pharmacy*  Lab Work: None ordered If you have labs (blood work) drawn today and your tests are completely normal, you will receive your results only by: MyChart Message (if you have MyChart) OR A paper copy in the mail If you have any lab test that is abnormal or we need to change your treatment, we will call you to review the results.  Testing/Procedures: Your physician has requested that you have an echocardiogram. Echocardiography is a painless test that uses sound waves to create images of your heart. It provides your doctor with information about the size and shape of your heart and how well your heart's chambers and valves are working. This procedure takes approximately one hour. There are no restrictions for this procedure. Please do NOT wear cologne, perfume, aftershave, or lotions (deodorant is allowed). Please arrive 15 minutes prior to your appointment time.  Please note: We ask at that you not bring children with you during ultrasound (echo/ vascular) testing. Due to room size and safety concerns, children are not allowed in the ultrasound rooms during exams. Our front office staff cannot provide observation of children in our lobby area while testing is being conducted. An adult accompanying a patient to their appointment will only be allowed in the ultrasound room at the discretion of the ultrasound technician under special circumstances. We apologize for any inconvenience.   Follow-Up: At [facility], you and your health needs are our priority.  As part of our continuing mission to provide you with exceptional heart care, our providers are all part of one team.  This team includes your primary Cardiologist (physician) and Advanced Practice Providers or APPs (Physician Assistants and Nurse Practitioners) who all work together to provide you with the care you need, when you need it.  Your next appointment:   1 month(s)  Provider:   Dr Kate or Jon Hails, PA-C **Note De-Identified [place] Obfuscation** We recommend signing up for the patient portal called MyChart.  Sign up information is provided on this After Visit Summary.  MyChart is used to connect with patients for Virtual Visits (Telemedicine).  Patients are able to view lab/test results, encounter notes, upcoming appointments, etc.  Non-urgent messages can be sent to your provider as well.   To learn more about what you can do with MyChart, go to [email or web address].      Signed, Jon Nat Hails, PA  04/05/2024 2:54 PM    [facility]

## 2024-04-05 ENCOUNTER — Encounter: Payer: Self-pay | Admitting: Physician Assistant

## 2024-04-05 ENCOUNTER — Ambulatory Visit: Attending: Physician Assistant | Admitting: Physician Assistant

## 2024-04-05 VITALS — BP 120/80 | HR 82 | Ht 62.0 in | Wt 130.4 lb

## 2024-04-05 DIAGNOSIS — I10 Essential (primary) hypertension: Secondary | ICD-10-CM | POA: Diagnosis not present

## 2024-04-05 DIAGNOSIS — R079 Chest pain, unspecified: Secondary | ICD-10-CM | POA: Diagnosis not present

## 2024-04-05 DIAGNOSIS — G8929 Other chronic pain: Secondary | ICD-10-CM | POA: Diagnosis not present

## 2024-04-05 NOTE — Patient Instructions (Addendum)
**Note De-Identified [place] Obfuscation** Medication Instructions:  1.Reduce aleve to 220 mg twice daily. 2.May take 1000 mg of tylenol  3 times daily. 3.May use voltaren gel as needed *If you need a refill on your cardiac medications before your next appointment, please call your pharmacy*  Lab Work: None ordered If you have labs (blood work) drawn today and your tests are completely normal, you will receive your results only by: MyChart Message (if you have MyChart) OR A paper copy in the mail If you have any lab test that is abnormal or we need to change your treatment, we will call you to review the results.  Testing/Procedures: Your physician has requested that you have an echocardiogram. Echocardiography is a painless test that uses sound waves to create images of your heart. It provides your doctor with information about the size and shape of your heart and how well your heart's chambers and valves are working. This procedure takes approximately one hour. There are no restrictions for this procedure. Please do NOT wear cologne, perfume, aftershave, or lotions (deodorant is allowed). Please arrive 15 minutes prior to your appointment time.  Please note: We ask at that you not bring children with you during ultrasound (echo/ vascular) testing. Due to room size and safety concerns, children are not allowed in the ultrasound rooms during exams. Our front office staff cannot provide observation of children in our lobby area while testing is being conducted. An adult accompanying a patient to their appointment will only be allowed in the ultrasound room at the discretion of the ultrasound technician under special circumstances. We apologize for any inconvenience.   Follow-Up: At [facility], you and your health needs are our priority.  As part of our continuing mission to provide you with exceptional heart care, our providers are all part of one team.  This team includes your primary Cardiologist (physician) and Advanced  Practice Providers or APPs (Physician Assistants and Nurse Practitioners) who all work together to provide you with the care you need, when you need it.  Your next appointment:   1 month(s)  Provider:   Dr Kate or Jon Hails, PA-C         We recommend signing up for the patient portal called MyChart.  Sign up information is provided on this After Visit Summary.  MyChart is used to connect with patients for Virtual Visits (Telemedicine).  Patients are able to view lab/test results, encounter notes, upcoming appointments, etc.  Non-urgent messages can be sent to your provider as well.   To learn more about what you can do with MyChart, go to [email or web address].

## 2024-04-06 DIAGNOSIS — M47816 Spondylosis without myelopathy or radiculopathy, lumbar region: Secondary | ICD-10-CM | POA: Diagnosis not present

## 2024-04-07 DIAGNOSIS — R300 Dysuria: Secondary | ICD-10-CM | POA: Diagnosis not present

## 2024-04-07 DIAGNOSIS — R8290 Unspecified abnormal findings in urine: Secondary | ICD-10-CM | POA: Diagnosis not present

## 2024-04-07 DIAGNOSIS — R109 Unspecified abdominal pain: Secondary | ICD-10-CM | POA: Diagnosis not present

## 2024-04-13 DIAGNOSIS — M47816 Spondylosis without myelopathy or radiculopathy, lumbar region: Secondary | ICD-10-CM | POA: Diagnosis not present

## 2024-04-18 DIAGNOSIS — R350 Frequency of micturition: Secondary | ICD-10-CM | POA: Diagnosis not present

## 2024-04-18 DIAGNOSIS — R300 Dysuria: Secondary | ICD-10-CM | POA: Diagnosis not present

## 2024-04-27 DIAGNOSIS — M47816 Spondylosis without myelopathy or radiculopathy, lumbar region: Secondary | ICD-10-CM | POA: Diagnosis not present

## 2024-05-03 DIAGNOSIS — M810 Age-related osteoporosis without current pathological fracture: Secondary | ICD-10-CM | POA: Diagnosis not present

## 2024-05-03 DIAGNOSIS — Z1231 Encounter for screening mammogram for malignant neoplasm of breast: Secondary | ICD-10-CM | POA: Diagnosis not present

## 2024-05-17 ENCOUNTER — Ambulatory Visit (HOSPITAL_COMMUNITY)
Admission: RE | Admit: 2024-05-17 | Discharge: 2024-05-17 | Disposition: A | Discharge status: Home / Self Care | Source: Ambulatory Visit | Attending: Internal Medicine | Admitting: Internal Medicine

## 2024-05-17 DIAGNOSIS — R079 Chest pain, unspecified: Secondary | ICD-10-CM | POA: Insufficient documentation

## 2024-05-17 LAB — ECHOCARDIOGRAM COMPLETE
Area-P 1/2: 2.39 cm2
S' Lateral: 3.1 cm

## 2024-05-18 ENCOUNTER — Ambulatory Visit: Payer: Self-pay | Admitting: Physician Assistant

## 2024-05-23 DIAGNOSIS — M47816 Spondylosis without myelopathy or radiculopathy, lumbar region: Secondary | ICD-10-CM | POA: Diagnosis not present

## 2024-05-24 ENCOUNTER — Ambulatory Visit: Admitting: Physician Assistant

## 2024-05-24 ENCOUNTER — Ambulatory Visit: Attending: Physician Assistant | Admitting: Physician Assistant

## 2024-05-24 ENCOUNTER — Encounter: Payer: Self-pay | Admitting: Physician Assistant

## 2024-05-24 VITALS — BP 126/70 | HR 91 | Ht 61.5 in | Wt 125.4 lb

## 2024-05-24 DIAGNOSIS — I10 Essential (primary) hypertension: Secondary | ICD-10-CM | POA: Diagnosis not present

## 2024-05-24 DIAGNOSIS — R0789 Other chest pain: Secondary | ICD-10-CM

## 2024-05-24 NOTE — Progress Notes (Unsigned)
**Note De-identified [place] [facility]bfuscation**  **Note De-Identified [place] [facility]uscation** Cardiology Office Note   Date:  05/24/2024  ID:  Karen Dickerson, DOB [birth year], MRN [id number] PCP: Aisha Harvey, MD  [facility] Providers Cardiologist:  Lonni LITTIE Nanas, MD Cardiology APP:  Madie Jon Garre, PA { Click to update primary MD,subspecialty MD or APP then REFRESH:1}    History of Present Illness Karen Dickerson is a 83 y.o. female with past medical history of hypertension, arthritis, chronic back pain and fibromyalgia.  Patient was seen in the ED on 03/30/2024 for chest pain.  Cardiac enzyme was negative.  EKG showed no ischemic changes.  BNP is 34.  CTA was negative for PE but showed emphysematous changes of the lung, large esophageal hiatal hernia.  Chest pain was felt to be atypical and reproducible on exam.  The chest discomfort also had positional component and it was pleuritic.  Since discharge, patient has been seen by Jon Madie in the [facility] on 04/05/2024.  It was suspected that her chest pain was musculoskeletal in nature as patient had pinpoint tenderness worse on palpation.  Echocardiogram was ordered to rule out cardiac effusion.  Echocardiogram completed on 05/17/2024 showed EF 50 to 55%, no regional wall motion abnormality, grade 1 DD, mild biatrial enlargement, mildly enlarged RV with normal pulmonary artery systolic pressure, trivial MR and trivial AI.   Patient presents today accompanied by daughter and husband.  Since her October ED visit, she only had a single episode of sharp shooting pain in the chest that lasted about 1 second.  She did undergo ablation for her back yesterday, this has limited her functional ability.  But she was able to walk around and do everyday housework without any exertional symptom.  The fact that she had hours and hours of chest discomfort when she went to the emergency room and still had 2 negative troponin making cardiac cause very unlikely.  Her EF is on the lower border normal but still considered  normal.  I suspect the mildly dilated right chamber of the heart is more related to emphysematous changes that was seen on the CT scan.  From a cardiac perspective, I think she is doing well and can follow-up as needed.  ROS: ***  Studies Reviewed      *** Risk Assessment/Calculations {Does this patient have ATRIAL FIBRILLATION?[phone]}         Physical Exam VS:  BP 126/70 (BP Location: Right Arm, Patient Position: Sitting, Cuff Size: Normal)   Pulse 91   Ht 5' 1.5 (1.562 m)   Wt 125 lb 6.4 oz (56.9 kg)   SpO2 96%   BMI 23.31 kg/m        Wt Readings from Last 3 Encounters:  05/24/24 125 lb 6.4 oz (56.9 kg)  04/05/24 130 lb 6.4 oz (59.1 kg)  03/30/24 128 lb (58.1 kg)    GEN: Well nourished, well developed in no acute distress NECK: No JVD; No carotid bruits CARDIAC: ***RRR, no murmurs, rubs, gallops RESPIRATORY:  Clear to auscultation without rales, wheezing or rhonchi  ABDOMEN: Soft, non-tender, non-distended EXTREMITIES:  No edema; No deformity   ASSESSMENT AND PLAN ***    {Are you ordering a CV Procedure (e.g. stress test, cath, DCCV, TEE, etc)?   Press F2        :[id number]}  Dispo: ***  Signed, Scot Ford, PA

## 2024-05-24 NOTE — Patient Instructions (Signed)
**Note De-Identified [place] [facility]bfuscation** Medication Instructions:  NO CHANGES *If you need a refill on your cardiac medications before your next appointment, please call your pharmacy*  Lab Work: NO LABS If you have labs (blood work) drawn today and your tests are completely normal, you will receive your results only by: MyChart Message (if you have MyChart) OR A paper copy in the mail If you have any lab test that is abnormal or we need to change your treatment, we will call you to review the results.  Testing/Procedures: NO TESTING  Follow-Up: At [facility], you and your health needs are our priority.  As part of our continuing mission to provide you with exceptional heart care, our providers are all part of one team.  This team includes your primary Cardiologist (physician) and Advanced Practice Providers or APPs (Physician Assistants and Nurse Practitioners) who all work together to provide you with the care you need, when you need it.  Your next appointment:   FOLLOW UP AS NEEDED  Provider:   Lonni LITTIE Nanas, MD    We recommend signing up for the patient portal called MyChart.  Sign up information is provided on this After Visit Summary.  MyChart is used to connect with patients for Virtual Visits (Telemedicine).  Patients are able to view lab/test results, encounter notes, upcoming appointments, etc.  Non-urgent messages can be sent to your provider as well.   To learn more about what you can do with MyChart, go to [email or web address].
